# Patient Record
Sex: Female | Born: 1990
Health system: Southern US, Community
[De-identification: ages and names within clinical notes are randomized; demographics above are authoritative.]

## PROBLEM LIST (undated history)

## (undated) DIAGNOSIS — K219 Gastro-esophageal reflux disease without esophagitis: Secondary | ICD-10-CM

## (undated) DIAGNOSIS — J45909 Unspecified asthma, uncomplicated: Secondary | ICD-10-CM

## (undated) DIAGNOSIS — F32A Depression, unspecified: Secondary | ICD-10-CM

## (undated) DIAGNOSIS — F329 Major depressive disorder, single episode, unspecified: Secondary | ICD-10-CM

## (undated) DIAGNOSIS — F419 Anxiety disorder, unspecified: Secondary | ICD-10-CM

## (undated) HISTORY — DX: Depression, unspecified: F32.A

## (undated) HISTORY — DX: Anxiety disorder, unspecified: F41.9

## (undated) HISTORY — DX: Major depressive disorder, single episode, unspecified: F32.9

## (undated) HISTORY — PX: FACIAL RECONSTRUCTION SURGERY: SHX631

## (undated) HISTORY — DX: Gastro-esophageal reflux disease without esophagitis: K21.9

## (undated) HISTORY — PX: TONSILLECTOMY: SUR1361

---
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
## 2015-08-08 ENCOUNTER — Emergency Department (HOSPITAL_COMMUNITY): Payer: Self-pay

## 2015-08-08 ENCOUNTER — Encounter (HOSPITAL_COMMUNITY): Payer: Self-pay | Admitting: Emergency Medicine

## 2015-08-08 ENCOUNTER — Emergency Department (HOSPITAL_COMMUNITY)
Admission: EM | Admit: 2015-08-08 | Discharge: 2015-08-08 | Disposition: A | Discharge status: Home / Self Care | Payer: Self-pay | Attending: Dermatology | Admitting: Dermatology

## 2015-08-08 DIAGNOSIS — R079 Chest pain, unspecified: Secondary | ICD-10-CM | POA: Insufficient documentation

## 2015-08-08 DIAGNOSIS — Z5321 Procedure and treatment not carried out due to patient leaving prior to being seen by health care provider: Secondary | ICD-10-CM | POA: Insufficient documentation

## 2015-08-08 HISTORY — DX: Unspecified asthma, uncomplicated: J45.909

## 2015-08-08 LAB — BASIC METABOLIC PANEL
Anion gap: 9 (ref 5–15)
BUN: 8 mg/dL (ref 6–20)
CALCIUM: 9.7 mg/dL (ref 8.9–10.3)
CO2: 25 mmol/L (ref 22–32)
CREATININE: 0.79 mg/dL (ref 0.44–1.00)
Chloride: 102 mmol/L (ref 101–111)
GLUCOSE: 88 mg/dL (ref 65–99)
Potassium: 4.5 mmol/L (ref 3.5–5.1)
Sodium: 136 mmol/L (ref 135–145)

## 2015-08-08 LAB — I-STAT TROPONIN, ED: TROPONIN I, POC: 0 ng/mL (ref 0.00–0.08)

## 2015-08-08 LAB — CBC
HCT: 36.2 % (ref 36.0–46.0)
Hemoglobin: 11.7 g/dL — ABNORMAL LOW (ref 12.0–15.0)
MCH: 23.8 pg — AB (ref 26.0–34.0)
MCHC: 32.3 g/dL (ref 30.0–36.0)
MCV: 73.7 fL — ABNORMAL LOW (ref 78.0–100.0)
PLATELETS: 306 10*3/uL (ref 150–400)
RBC: 4.91 MIL/uL (ref 3.87–5.11)
RDW: 14.8 % (ref 11.5–15.5)
WBC: 7.4 10*3/uL (ref 4.0–10.5)

## 2015-08-08 NOTE — ED Notes (Signed)
Pt deciding to leave prior to being seen.
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
## 2016-02-13 ENCOUNTER — Encounter (HOSPITAL_COMMUNITY): Payer: Self-pay

## 2016-03-03 ENCOUNTER — Ambulatory Visit (INDEPENDENT_AMBULATORY_CARE_PROVIDER_SITE_OTHER): Payer: 59 | Admitting: Licensed Clinical Social Worker

## 2016-03-03 DIAGNOSIS — F331 Major depressive disorder, recurrent, moderate: Secondary | ICD-10-CM

## 2016-03-03 DIAGNOSIS — F410 Panic disorder [episodic paroxysmal anxiety] without agoraphobia: Secondary | ICD-10-CM | POA: Diagnosis not present
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
## 2016-03-17 ENCOUNTER — Ambulatory Visit (INDEPENDENT_AMBULATORY_CARE_PROVIDER_SITE_OTHER): Payer: 59 | Admitting: Licensed Clinical Social Worker

## 2016-03-17 DIAGNOSIS — F331 Major depressive disorder, recurrent, moderate: Secondary | ICD-10-CM | POA: Diagnosis not present

## 2016-03-17 DIAGNOSIS — F410 Panic disorder [episodic paroxysmal anxiety] without agoraphobia: Secondary | ICD-10-CM

## 2016-03-24 ENCOUNTER — Ambulatory Visit (INDEPENDENT_AMBULATORY_CARE_PROVIDER_SITE_OTHER): Payer: 59 | Admitting: Licensed Clinical Social Worker

## 2016-03-24 DIAGNOSIS — F331 Major depressive disorder, recurrent, moderate: Secondary | ICD-10-CM | POA: Diagnosis not present

## 2016-03-30 NOTE — Progress Notes (Signed)
  THERAPIST PROGRESS NOTE   Date of Service:   03/17/2016  Session Time: 4pm  Patient:   Rebecca Simmons   DOB:   [birth year]  MR Number:  [id number]  Location[facility]  [facility] [place] [address] Dept: [phone]            Provider/Observer:  Lubertha South Counselor  Risk of Suicide/Violence: virtually non-existent   Diagnosis:    Depression, Moderate  Type of Therapy: Individual Therapy  Treatment Goals addressed: Coping  Participation Level: Active     Interventions: CBT, Motivational Interviewing, Solution Focused, Strength-based, Supportive and Reframing   Behavioral Response: Neat and Well GroomedAlertAnxious   Summary: Therapist met with Patient in an initial therapy session to assess current mood and to build rapport. Therapist engaged Patient in discussion about her life and what is going well for her. Therapist provided support for Patient as she shared details about her life, her current stressors, mood, coping skills, her past, and her family. Therapist prompted Patient to discuss her support system and ways that she manages her daily stress, anger, and frustrations. LCSW discussed what psychotherapy is and is not and the importance of the therapeutic relationship to include open and honest communication between client and therapist and building trust.  Reviewed advantages and disadvantages of the therapeutic process and limitations to the therapeutic relationship including LCSW's role in maintaining the safety of the client, others and those in client's care.    Plan: Morgana Rowley will utilize coping skills taught in this session to reduce unwanted thoughts.    Return again in 1 weeks.

## 2016-04-01 ENCOUNTER — Ambulatory Visit: Payer: 59 | Admitting: Licensed Clinical Social Worker

## 2016-04-02 NOTE — Progress Notes (Signed)
  THERAPIST PROGRESS NOTE   Date of Service:   03/24/2016  Session Time: 4pm  Patient:   Zayli Villafuerte   DOB:   Sep 21, 1990  MR Number:  [id number]  Location:  [facility] [place] [address] Dept: [phone]            Provider/Observer:  Lubertha South Counselor  Risk of Suicide/Violence: virtually non-existent   Diagnosis:    Depression, Moderate  Type of Therapy: Individual Therapy  Treatment Goals addressed: Coping  Participation Level: Active     Interventions: CBT, Motivational Interviewing, Solution Focused, Strength-based, Supportive and Reframing   Behavioral Response: Neat and Well GroomedAlertAnxious   Summary: Therapist met with Patient in an outpatient setting to assess current mood and assist with making progress towards his goals through the use of therapeutic intervention. Therapist did a brief mood check, assessing anger, fear, disgust, excitement, happiness, and sadness. Therapist provided active listening for Patient as she communicated her current stressors (getting anxious while working).  Therapist explained the importance of establishing emotional support as well as ensuring that Patient is engaging in adequate self-care during these stress induced times. Therapist then assisted Patient with brainstorming to determine a solution for her current work problem. Therapist and Patient discussed possible financial supports, emotional supports, family, friends, etc.    Plan: Fendi Meinhardt will utilize coping skills taught in this session to reduce unwanted thoughts.    Return again in 1 weeks.

## 2016-04-06 ENCOUNTER — Ambulatory Visit: Payer: 59 | Admitting: Licensed Clinical Social Worker

## 2016-04-10 ENCOUNTER — Ambulatory Visit (INDEPENDENT_AMBULATORY_CARE_PROVIDER_SITE_OTHER): Payer: 59 | Admitting: Licensed Clinical Social Worker

## 2016-04-10 DIAGNOSIS — F331 Major depressive disorder, recurrent, moderate: Secondary | ICD-10-CM | POA: Diagnosis not present
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
## 2016-04-29 ENCOUNTER — Emergency Department
Admission: EM | Admit: 2016-04-29 | Discharge: 2016-04-29 | Disposition: A | Discharge status: Home / Self Care | Payer: 59 | Attending: Emergency Medicine | Admitting: Emergency Medicine

## 2016-04-29 ENCOUNTER — Encounter: Payer: Self-pay | Admitting: Emergency Medicine

## 2016-04-29 ENCOUNTER — Emergency Department: Payer: 59

## 2016-04-29 DIAGNOSIS — S60222A Contusion of left hand, initial encounter: Secondary | ICD-10-CM | POA: Insufficient documentation

## 2016-04-29 DIAGNOSIS — J45909 Unspecified asthma, uncomplicated: Secondary | ICD-10-CM | POA: Diagnosis not present

## 2016-04-29 DIAGNOSIS — Y999 Unspecified external cause status: Secondary | ICD-10-CM | POA: Insufficient documentation

## 2016-04-29 DIAGNOSIS — M7918 Myalgia, other site: Secondary | ICD-10-CM

## 2016-04-29 DIAGNOSIS — S8991XA Unspecified injury of right lower leg, initial encounter: Secondary | ICD-10-CM | POA: Diagnosis present

## 2016-04-29 DIAGNOSIS — Y92410 Unspecified street and highway as the place of occurrence of the external cause: Secondary | ICD-10-CM | POA: Diagnosis not present

## 2016-04-29 DIAGNOSIS — S8011XA Contusion of right lower leg, initial encounter: Secondary | ICD-10-CM | POA: Insufficient documentation

## 2016-04-29 DIAGNOSIS — Y9389 Activity, other specified: Secondary | ICD-10-CM | POA: Insufficient documentation

## 2016-04-29 MED ORDER — KETOROLAC TROMETHAMINE 30 MG/ML IJ SOLN
60.0000 mg | Freq: Once | INTRAMUSCULAR | Status: AC
  Administered 2016-04-29: 60 mg via INTRAMUSCULAR

## 2016-04-29 MED ORDER — KETOROLAC TROMETHAMINE 10 MG PO TABS: 10.0000 mg | ORAL_TABLET | Freq: Three times a day (TID) | ORAL | 0 refills | Status: DC

## 2016-04-29 MED ORDER — KETOROLAC TROMETHAMINE 30 MG/ML IJ SOLN
INTRAMUSCULAR | Status: AC
Start: 2016-04-29 — End: 2016-04-29
  Administered 2016-04-29: 60 mg via INTRAMUSCULAR
  Filled 2016-04-29: qty 1

## 2016-04-29 MED ORDER — CYCLOBENZAPRINE HCL 5 MG PO TABS: 5.0000 mg | ORAL_TABLET | Freq: Three times a day (TID) | ORAL | 0 refills | Status: DC | PRN

## 2016-04-29 MED ORDER — ORPHENADRINE CITRATE 30 MG/ML IJ SOLN
60.0000 mg | INTRAMUSCULAR | Status: AC
  Administered 2016-04-29: 60 mg via INTRAMUSCULAR

## 2016-04-29 MED ORDER — ORPHENADRINE CITRATE 30 MG/ML IJ SOLN
INTRAMUSCULAR | Status: AC
Start: 2016-04-29 — End: 2016-04-29
  Administered 2016-04-29: 60 mg via INTRAMUSCULAR
  Filled 2016-04-29: qty 2

## 2016-04-29 NOTE — ED Notes (Signed)
Pt returned from xray via stretcher.

## 2016-04-29 NOTE — ED Notes (Signed)
Pt stated she didn't want to given urine to have a negative urine preg on record, stated she would sign the paper for xray instead. Xray notified.
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
## 2016-04-29 NOTE — ED Notes (Signed)
Pt taken to xray via stretcher  
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
## 2016-07-08 ENCOUNTER — Ambulatory Visit (INDEPENDENT_AMBULATORY_CARE_PROVIDER_SITE_OTHER): Payer: 59 | Admitting: Obstetrics and Gynecology

## 2016-07-08 ENCOUNTER — Encounter: Payer: Self-pay | Admitting: Obstetrics and Gynecology

## 2016-07-08 VITALS — BP 106/65 | HR 75 | Ht 65.0 in | Wt 241.0 lb

## 2016-07-08 DIAGNOSIS — N83202 Unspecified ovarian cyst, left side: Secondary | ICD-10-CM | POA: Diagnosis not present
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
## 2017-03-29 ENCOUNTER — Other Ambulatory Visit: Payer: Self-pay

## 2017-03-29 MED ORDER — FLUOXETINE HCL 10 MG PO CAPS: ORAL_CAPSULE | ORAL | 3 refills | Status: DC

## 2017-04-23 ENCOUNTER — Ambulatory Visit (INDEPENDENT_AMBULATORY_CARE_PROVIDER_SITE_OTHER): Payer: 59 | Admitting: Nurse Practitioner

## 2017-04-23 ENCOUNTER — Encounter: Payer: Self-pay | Admitting: Nurse Practitioner

## 2017-04-23 VITALS — BP 116/74 | HR 77 | Resp 16 | Ht 65.0 in | Wt 248.0 lb

## 2017-04-23 DIAGNOSIS — F331 Major depressive disorder, recurrent, moderate: Secondary | ICD-10-CM | POA: Diagnosis not present

## 2017-04-23 DIAGNOSIS — G5601 Carpal tunnel syndrome, right upper limb: Secondary | ICD-10-CM

## 2017-04-23 DIAGNOSIS — E559 Vitamin D deficiency, unspecified: Secondary | ICD-10-CM

## 2017-04-23 MED ORDER — PREDNISONE 10 MG (21) PO TBPK: ORAL_TABLET | ORAL | 0 refills | Status: DC

## 2017-04-23 NOTE — Progress Notes (Signed)
[facility] [address] [place], [place] [address]  Internal MEDICINE  Office Visit Note  Patient Name: Rebecca Simmons  [id number]  [id number]  Date of Service: 05/02/2017  Chief Complaint  Patient presents with  . Wrist Pain    right    The patient is c/o right wrist pain, along the medical aspect and into the thumb. Got much worse about 3-4 weeks ago, after sleeping on it worng. Now gets swollen every day, has pocket of fluid which comes up every day. Has to wear a splint on the arm in order to calm the pain down. She is taking nambutone as needed for back pain, provided by pain clinic. Has not taken other pain medication yet.    Wrist Pain   The pain is present in the right hand. This is a new problem. The current episode started 1 to 4 weeks ago. There has been no history of extremity trauma. The problem occurs daily. The problem has been unchanged. The quality of the pain is described as aching. The pain is moderate. The symptoms are aggravated by activity. She has tried NSAIDS for the symptoms. The treatment provided mild relief. history of carpal tunnel.     Pt is here for routine follow up.    Current Medication: Outpatient Encounter Medications as of 04/23/2017  Medication Sig  . albuterol (PROAIR HFA) 108 (90 Base) MCG/ACT inhaler Inhale 2 puffs into the lungs every 6 (six) hours as needed for wheezing or shortness of breath.  . baclofen (LIORESAL) 10 MG tablet TK 1 T PO TID PRN  . busPIRone (BUSPAR) 5 MG tablet buspirone 5 mg tablet  TK 1 T PO BID PRF ACUTE ANXIETY  . cyclobenzaprine (FLEXERIL) 5 MG tablet Take 1 tablet (5 mg total) by mouth 3 (three) times daily as needed for muscle spasms.  Marland Kitchen FLUoxetine (PROZAC) 10 MG capsule Take one capsule by mouth twice daily increase to 2 capsules by mouth twice daily for 1 week prior to menstrual cycle to completion of cycle  . nabumetone (RELAFEN) 750 MG tablet TK 1 T PO Q 12 H PRF ACHING AND STIFFNESS  .  predniSONE (STERAPRED UNI-PAK 21 TAB) 10 MG (21) TBPK tablet 6 day taper - take by mouth as directed for 6 days  . [DISCONTINUED] ketorolac (TORADOL) 10 MG tablet TK 1 T PO Q 8 H   No facility-administered encounter medications on file as of 04/23/2017.     Surgical History: Past Surgical History:  Procedure Laterality Date  . FACIAL RECONSTRUCTION SURGERY     @27  years of age  . TONSILLECTOMY      Medical History: Past Medical History:  Diagnosis Date  . Anxiety   . Asthma   . Depression     Family History: Family History  Problem Relation Age of Onset  . Rheum arthritis Mother   . Cancer Maternal Uncle   . Seizures Maternal Uncle   . Breast cancer Maternal Grandmother   . Diabetes Maternal Grandmother   . Stroke Maternal Grandmother   . Cancer Paternal Grandfather     Social History   Socioeconomic History  . Marital status: Single    Spouse name: Not on file  . Number of children: Not on file  . Years of education: Not on file  . Highest education level: Not on file  Social Needs  . Financial resource strain: Not on file  . Food insecurity - worry: Not on file  . Food insecurity - inability:  Not on file  . Transportation needs - medical: Not on file  . Transportation needs - non-medical: Not on file  Occupational History  . Not on file  Tobacco Use  . Smoking status: Never Smoker  . Smokeless tobacco: Never Used  Substance and Sexual Activity  . Alcohol use: No  . Drug use: No  . Sexual activity: Yes    Birth control/protection: None  Other Topics Concern  . Not on file  Social History Narrative  . Not on file      Review of Systems  Constitutional: Positive for fatigue.  HENT: Negative for congestion, nosebleeds, postnasal drip and sinus pressure.   Eyes: Negative.   Respiratory: Negative for chest tightness and wheezing.   Cardiovascular: Negative for chest pain and palpitations.  Gastrointestinal: Negative.   Endocrine: Negative for cold  intolerance, heat intolerance, polyphagia and polyuria.  Genitourinary: Negative.   Musculoskeletal: Positive for arthralgias.       Right wrist pain  Skin: Negative for rash.  Allergic/Immunologic: Negative for environmental allergies.  Neurological: Negative.   Hematological: Negative.   Psychiatric/Behavioral: Positive for dysphoric mood. The patient is nervous/anxious.     Today's Vitals   04/23/17 0836  BP: 116/74  Pulse: 77  Resp: 16  SpO2: 99%  Weight: 248 lb (112.5 kg)  Height: 5\' 5"  (1.651 m)    Physical Exam  Constitutional: She is oriented to person, place, and time. She appears well-developed and well-nourished. No distress.  HENT:  Head: Normocephalic and atraumatic.  Mouth/Throat: Oropharynx is clear and moist. No oropharyngeal exudate.  Eyes: EOM are normal. Pupils are equal, round, and reactive to light.  Neck: Normal range of motion. Neck supple. No JVD present. No tracheal deviation present. No thyromegaly present.  Cardiovascular: Normal rate, regular rhythm and normal heart sounds. Exam reveals no gallop and no friction rub.  No murmur heard. Pulmonary/Chest: Effort normal and breath sounds normal. No respiratory distress. She has no wheezes. She has no rales. She exhibits no tenderness.  Abdominal: Soft. Bowel sounds are normal. There is no tenderness.  Musculoskeletal:       Right wrist: She exhibits decreased range of motion and tenderness.       Arms: Lymphadenopathy:    She has no cervical adenopathy.  Neurological: She is alert and oriented to person, place, and time. No cranial nerve deficit.  Skin: Skin is warm and dry. She is not diaphoretic.  Psychiatric: She has a normal mood and affect. Her behavior is normal. Judgment and thought content normal.  Nursing note and vitals reviewed.   Assessment/Plan:  1. Carpal tunnel syndrome of right wrist Discussed use of carpal tunnel splint at night. Prednisone taper as directed to reduce pain and  inflammation. reffer to orthopedics for further evaluation and treatment.  - Ambulatory referral to Orthopedic Surgery - predniSONE (STERAPRED UNI-PAK 21 TAB) 10 MG (21) TBPK tablet; 6 day taper - take by mouth as directed for 6 days  Dispense: 21 tablet; Refill: 0  2. Major depressive disorder, recurrent episode, moderate (HCC) Stable. Continue anti-depressant therapy as prescribed. Visits with counselor as scheduled.   3. Vitamin D deficiency Continue use of OTC vitamin d 2000iu daily.   General Counseling: Saja verbalizes understanding of the findings of todays visit and agrees with plan of treatment. I have discussed any further diagnostic evaluation that may be needed or ordered today. We also reviewed her medications today. she has been encouraged to call the office with any questions or concerns  that should arise related to todays visit.   This patient was seen by Vincent Gros, FNP- C in Collaboration with Dr Lyndon Code as a part of collaborative care agreement    Orders Placed This Encounter  Procedures  . Ambulatory referral to Orthopedic Surgery    Meds ordered this encounter  Medications  . predniSONE (STERAPRED UNI-PAK 21 TAB) 10 MG (21) TBPK tablet    Sig: 6 day taper - take by mouth as directed for 6 days    Dispense:  21 tablet    Refill:  0    Order Specific Question:   Supervising Provider    Answer:   Lyndon Code [[id number]]    Time spent: 78 Minutes     Dr Lyndon Code Internal medicine

## 2017-06-03 ENCOUNTER — Telehealth: Payer: Self-pay | Admitting: Nurse Practitioner

## 2017-06-03 NOTE — Telephone Encounter (Signed)
I have treated her for depression/anxiety in the past, but I believed she was seeing psychiatry and counsseling? Last visit I have was for carpal tunnel.

## 2017-06-03 NOTE — Telephone Encounter (Signed)
Left message advising pt that we can only type a simple note stating her medications and what they are prescribed for, we are unable to type letters discussing her medical in depth and if she is needing detailed letter it will need to come from referring provider.Rebecca Simmons

## 2017-10-15 ENCOUNTER — Encounter: Payer: Self-pay | Admitting: Nurse Practitioner

## 2017-10-15 ENCOUNTER — Ambulatory Visit (INDEPENDENT_AMBULATORY_CARE_PROVIDER_SITE_OTHER): Payer: 59 | Admitting: Nurse Practitioner

## 2017-10-15 VITALS — BP 127/75 | HR 75 | Resp 16 | Ht 64.0 in | Wt 249.0 lb

## 2017-10-15 DIAGNOSIS — F331 Major depressive disorder, recurrent, moderate: Secondary | ICD-10-CM

## 2017-10-15 DIAGNOSIS — R5383 Other fatigue: Secondary | ICD-10-CM | POA: Insufficient documentation

## 2017-10-15 MED ORDER — FLUOXETINE HCL 20 MG PO TABS: 20.0000 mg | ORAL_TABLET | Freq: Every day | ORAL | 3 refills | Status: DC

## 2017-10-15 NOTE — Progress Notes (Signed)
[facility] [address] [place], [place] [address]  Internal MEDICINE  Office Visit Note  Patient Name: Rebecca GoldenDominique Simmons  1992  [id number]  Date of Service: 10/15/2017  Chief Complaint  Patient presents with  . Depression    discuss medications    The patient states that she has been having intermittent episodes of worsening episodes. She has had a few days which have been so bad, she has been unable to go to work. Episodes have been happening once every 2 to 3 months and last for a few days. She has seen a counselor in the past, but did not "click" with the particular counselor she saw. She has had intake phone appointment with new counselor. She is waiting to hear about initial appointment. She has brought FMLA paperwork to me and will need to have this filled out to accommodate intermittent leave if needed.       Current Medication: Outpatient Encounter Medications as of 10/15/2017  Medication Sig  . albuterol (PROAIR HFA) 108 (90 Base) MCG/ACT inhaler Inhale 2 puffs into the lungs every 6 (six) hours as needed for wheezing or shortness of breath.  . baclofen (LIORESAL) 10 MG tablet TK 1 T PO TID PRN  . busPIRone (BUSPAR) 5 MG tablet buspirone 5 mg tablet  TK 1 T PO BID PRF ACUTE ANXIETY  . cyclobenzaprine (FLEXERIL) 5 MG tablet Take 1 tablet (5 mg total) by mouth 3 (three) times daily as needed for muscle spasms.  . nabumetone (RELAFEN) 750 MG tablet TK 1 T PO Q 12 H PRF ACHING AND STIFFNESS  . predniSONE (STERAPRED UNI-PAK 21 TAB) 10 MG (21) TBPK tablet 6 day taper - take by mouth as directed for 6 days  . [DISCONTINUED] FLUoxetine (PROZAC) 10 MG capsule Take one capsule by mouth twice daily increase to 2 capsules by mouth twice daily for 1 week prior to menstrual cycle to completion of cycle  . FLUoxetine (PROZAC) 20 MG tablet Take 1 tablet (20 mg total) by mouth daily.   No facility-administered encounter medications on file as of 10/15/2017.     Surgical  History: Past Surgical History:  Procedure Laterality Date  . FACIAL RECONSTRUCTION SURGERY     @27  years of age  . TONSILLECTOMY      Medical History: Past Medical History:  Diagnosis Date  . Anxiety   . Asthma   . Depression     Family History: Family History  Problem Relation Age of Onset  . Rheum arthritis Mother   . Cancer Maternal Uncle   . Seizures Maternal Uncle   . Breast cancer Maternal Grandmother   . Diabetes Maternal Grandmother   . Stroke Maternal Grandmother   . Cancer Paternal Grandfather     Social History   Socioeconomic History  . Marital status: Single    Spouse name: Not on file  . Number of children: Not on file  . Years of education: Not on file  . Highest education level: Not on file  Occupational History  . Not on file  Social Needs  . Financial resource strain: Not on file  . Food insecurity:    Worry: Not on file    Inability: Not on file  . Transportation needs:    Medical: Not on file    Non-medical: Not on file  Tobacco Use  . Smoking status: Never Smoker  . Smokeless tobacco: Never Used  Substance and Sexual Activity  . Alcohol use: Yes    Comment: socially  .  Drug use: No  . Sexual activity: Yes    Birth control/protection: None  Lifestyle  . Physical activity:    Days per week: Not on file    Minutes per session: Not on file  . Stress: Not on file  Relationships  . Social connections:    Talks on phone: Not on file    Gets together: Not on file    Attends religious service: Not on file    Active member of club or organization: Not on file    Attends meetings of clubs or organizations: Not on file    Relationship status: Not on file  . Intimate partner violence:    Fear of current or ex partner: Not on file    Emotionally abused: Not on file    Physically abused: Not on file    Forced sexual activity: Not on file  Other Topics Concern  . Not on file  Social History Narrative  . Not on file      Review of  Systems  Constitutional: Positive for fatigue. Negative for chills and unexpected weight change.  HENT: Negative for congestion, postnasal drip, rhinorrhea, sneezing and sore throat.   Eyes: Negative.  Negative for redness.  Respiratory: Negative for cough, chest tightness and shortness of breath.   Cardiovascular: Negative for chest pain and palpitations.  Gastrointestinal: Negative for abdominal pain, constipation, diarrhea, nausea and vomiting.  Endocrine: Negative for cold intolerance, heat intolerance, polydipsia and polyphagia.  Genitourinary: Negative.  Negative for dysuria and frequency.  Musculoskeletal: Negative for arthralgias, back pain, joint swelling and neck pain.  Skin: Negative for rash.  Allergic/Immunologic: Negative for environmental allergies.  Neurological: Negative for dizziness, tremors, numbness and headaches.  Hematological: Negative for adenopathy. Does not bruise/bleed easily.  Psychiatric/Behavioral: Positive for dysphoric mood. Negative for behavioral problems (Depression), sleep disturbance and suicidal ideas. The patient is nervous/anxious.     Today's Vitals   10/15/17 0917  BP: 127/75  Pulse: 75  Resp: 16  SpO2: 98%  Weight: 249 lb (112.9 kg)  Height: 5\' 4"  (1.626 m)   Physical Exam  Constitutional: She is oriented to person, place, and time. She appears well-developed and well-nourished. No distress.  HENT:  Head: Normocephalic and atraumatic.  Nose: Nose normal.  Mouth/Throat: Oropharynx is clear and moist. No oropharyngeal exudate.  Eyes: Pupils are equal, round, and reactive to light. EOM are normal.  Neck: Normal range of motion. Neck supple. No JVD present. No tracheal deviation present. No thyromegaly present.  Cardiovascular: Normal rate, regular rhythm and normal heart sounds. Exam reveals no gallop and no friction rub.  No murmur heard. Pulmonary/Chest: Effort normal and breath sounds normal. No respiratory distress. She has no wheezes.  She has no rales. She exhibits no tenderness.  Abdominal: Soft. Bowel sounds are normal. There is no tenderness.  Musculoskeletal: Normal range of motion.  Lymphadenopathy:    She has no cervical adenopathy.  Neurological: She is alert and oriented to person, place, and time. No cranial nerve deficit.  Skin: Skin is warm and dry. She is not diaphoretic.  Psychiatric: Her speech is normal and behavior is normal. Judgment and thought content normal. Her mood appears anxious. Cognition and memory are normal. She exhibits a depressed mood.  Nursing note and vitals reviewed.  Assessment/Plan: 1. Other fatigue Likely related to increased depression. Adjust medications and follow up 6 weeks.   2. Major depressive disorder, recurrent episode, moderate (HCC) Increased prozac to 20mg  daily. Continue to take buspirone as needed  and as prescribed. Will fill out FMLA paperwork and retrun to patient when complete. Contact information for [facility] provided today. - FLUoxetine (PROZAC) 20 MG tablet; Take 1 tablet (20 mg total) by mouth daily.  Dispense: 30 tablet; Refill: 3  General Counseling: Lamyra verbalizes understanding of the findings of todays visit and agrees with plan of treatment. I have discussed any further diagnostic evaluation that may be needed or ordered today. We also reviewed her medications today. she has been encouraged to call the office with any questions or concerns that should arise related to todays visit.  This patient was seen by Vincent Gros FNP Collaboration with Dr Lyndon Code as a part of collaborative care agreement  Meds ordered this encounter  Medications  . FLUoxetine (PROZAC) 20 MG tablet    Sig: Take 1 tablet (20 mg total) by mouth daily.    Dispense:  30 tablet    Refill:  3    Please note increased dose. Patient does not need new rx yet. Will call when ready.    Order Specific Question:   Supervising Provider    Answer:   Lyndon Code [[id number]]    Time spent: 59 Minutes      Dr Lyndon Code Internal medicine

## 2017-10-25 ENCOUNTER — Telehealth: Payer: Self-pay

## 2017-10-25 NOTE — Telephone Encounter (Signed)
Patient has called several times regarding her [employer] papers

## 2017-10-26 ENCOUNTER — Telehealth: Payer: Self-pay

## 2017-10-26 NOTE — Telephone Encounter (Signed)
Patient advised that her FMLA paperwork is ready for pickup.Titania

## 2017-10-26 NOTE — Telephone Encounter (Signed)
I finished this completely and put it on Rebecca Simmons's desk this morning.

## 2017-11-26 ENCOUNTER — Encounter: Payer: Self-pay | Admitting: Nurse Practitioner

## 2017-11-26 ENCOUNTER — Ambulatory Visit (INDEPENDENT_AMBULATORY_CARE_PROVIDER_SITE_OTHER): Payer: 59 | Admitting: Nurse Practitioner

## 2017-11-26 VITALS — BP 118/84 | HR 79 | Resp 16 | Ht 64.75 in | Wt 255.8 lb

## 2017-11-26 DIAGNOSIS — F411 Generalized anxiety disorder: Secondary | ICD-10-CM | POA: Diagnosis not present

## 2017-11-26 DIAGNOSIS — F331 Major depressive disorder, recurrent, moderate: Secondary | ICD-10-CM | POA: Diagnosis not present

## 2017-11-26 DIAGNOSIS — R5383 Other fatigue: Secondary | ICD-10-CM

## 2017-11-26 MED ORDER — BUSPIRONE HCL 5 MG PO TABS: 5.0000 mg | ORAL_TABLET | Freq: Three times a day (TID) | ORAL | 3 refills | Status: DC

## 2017-11-26 NOTE — Progress Notes (Signed)
[facility] [address] [place], [place] [address]  Internal MEDICINE  Office Visit Note   Patient Name: Rebecca Simmons  [id number]  [id number]  Date of Service: 11/26/2017  Chief Complaint  Patient presents with  . Depression    follow up increased medication, doing well  . Anxiety    The patient had been having increased episodes of anxiety and panic attacks. I increased her fluoxetine to 20mg  daily. She states that this dose is working well for her for the most part. Feels like herself and is able to control panic attacks while at work. She did have one panic attack, which manifested as left upper quadrant pain and epigastric pain. It resolved on it's own after removal from the triggering situation. She states that buspirone does help when she takes it. Lessens the severity and length of time she has acute panic attack reactions.       Current Medication: Outpatient Encounter Medications as of 11/26/2017  Medication Sig  . albuterol (PROAIR HFA) 108 (90 Base) MCG/ACT inhaler Inhale 2 puffs into the lungs every 6 (six) hours as needed for wheezing or shortness of breath.  . baclofen (LIORESAL) 10 MG tablet TK 1 T PO TID PRN  . busPIRone (BUSPAR) 5 MG tablet Take 1 tablet (5 mg total) by mouth 3 (three) times daily.  Marland Kitchen FLUoxetine (PROZAC) 20 MG tablet Take 1 tablet (20 mg total) by mouth daily.  . nabumetone (RELAFEN) 750 MG tablet TK 1 T PO Q 12 H PRF ACHING AND STIFFNESS  . [DISCONTINUED] busPIRone (BUSPAR) 5 MG tablet buspirone 5 mg tablet  TK 1 T PO BID PRF ACUTE ANXIETY  . [DISCONTINUED] cyclobenzaprine (FLEXERIL) 5 MG tablet Take 1 tablet (5 mg total) by mouth 3 (three) times daily as needed for muscle spasms. (Patient not taking: Reported on 11/26/2017)  . [DISCONTINUED] predniSONE (STERAPRED UNI-PAK 21 TAB) 10 MG (21) TBPK tablet 6 day taper - take by mouth as directed for 6 days (Patient not taking: Reported on 11/26/2017)   No facility-administered encounter  medications on file as of 11/26/2017.     Surgical History: Past Surgical History:  Procedure Laterality Date  . FACIAL RECONSTRUCTION SURGERY     @27  years of age  . TONSILLECTOMY      Medical History: Past Medical History:  Diagnosis Date  . Anxiety   . Asthma   . Depression     Family History: Family History  Problem Relation Age of Onset  . Rheum arthritis Mother   . Cancer Maternal Uncle   . Seizures Maternal Uncle   . Breast cancer Maternal Grandmother   . Diabetes Maternal Grandmother   . Stroke Maternal Grandmother   . Cancer Paternal Grandfather     Social History   Socioeconomic History  . Marital status: Single    Spouse name: Not on file  . Number of children: Not on file  . Years of education: Not on file  . Highest education level: Not on file  Occupational History  . Not on file  Social Needs  . Financial resource strain: Not on file  . Food insecurity:    Worry: Not on file    Inability: Not on file  . Transportation needs:    Medical: Not on file    Non-medical: Not on file  Tobacco Use  . Smoking status: Never Smoker  . Smokeless tobacco: Never Used  Substance and Sexual Activity  . Alcohol use: Yes    Comment: socially  .  Drug use: No  . Sexual activity: Yes    Birth control/protection: None  Lifestyle  . Physical activity:    Days per week: Not on file    Minutes per session: Not on file  . Stress: Not on file  Relationships  . Social connections:    Talks on phone: Not on file    Gets together: Not on file    Attends religious service: Not on file    Active member of club or organization: Not on file    Attends meetings of clubs or organizations: Not on file    Relationship status: Not on file  . Intimate partner violence:    Fear of current or ex partner: Not on file    Emotionally abused: Not on file    Physically abused: Not on file    Forced sexual activity: Not on file  Other Topics Concern  . Not on file  Social  History Narrative  . Not on file      Review of Systems  Constitutional: Negative for activity change, chills, fatigue and unexpected weight change.  HENT: Negative for congestion, postnasal drip, rhinorrhea, sneezing and sore throat.   Eyes: Negative.  Negative for redness.  Respiratory: Negative for cough, chest tightness and shortness of breath.   Cardiovascular: Negative for chest pain and palpitations.  Gastrointestinal: Negative for abdominal pain, constipation, diarrhea, nausea and vomiting.  Endocrine: Negative for cold intolerance, heat intolerance, polydipsia and polyphagia.  Genitourinary: Negative for dysuria and frequency.       Last menstrual cycle was heavier than normal and she passed may blood clots even when not using the bathroom.   Musculoskeletal: Negative for arthralgias, back pain, joint swelling and neck pain.  Skin: Negative for rash.  Allergic/Immunologic: Negative for environmental allergies.  Neurological: Negative for dizziness, tremors, numbness and headaches.  Hematological: Negative for adenopathy. Does not bruise/bleed easily.  Psychiatric/Behavioral: Positive for dysphoric mood. Negative for behavioral problems (Depression), sleep disturbance and suicidal ideas. The patient is nervous/anxious.        Improved and well managed.     Vital Signs: BP 118/84   Pulse 79   Resp 16   Ht 5' 4.75" (1.645 m)   Wt 255 lb 12.8 oz (116 kg)   SpO2 97%   BMI 42.90 kg/m    Physical Exam  Constitutional: She is oriented to person, place, and time. She appears well-developed and well-nourished. No distress.  HENT:  Head: Normocephalic and atraumatic.  Nose: Nose normal.  Mouth/Throat: Oropharynx is clear and moist. No oropharyngeal exudate.  Eyes: Pupils are equal, round, and reactive to light. Conjunctivae and EOM are normal.  Neck: Normal range of motion. Neck supple. No JVD present. No tracheal deviation present. No thyromegaly present.  Cardiovascular:  Normal rate, regular rhythm and normal heart sounds. Exam reveals no gallop and no friction rub.  No murmur heard. Pulmonary/Chest: Effort normal and breath sounds normal. No respiratory distress. She has no wheezes. She has no rales. She exhibits no tenderness.  Abdominal: Soft. Bowel sounds are normal. There is no tenderness.  Musculoskeletal: Normal range of motion.  Lymphadenopathy:    She has no cervical adenopathy.  Neurological: She is alert and oriented to person, place, and time. No cranial nerve deficit.  Skin: Skin is warm and dry. She is not diaphoretic.  Psychiatric: Her speech is normal and behavior is normal. Judgment and thought content normal. Her mood appears anxious. Cognition and memory are normal. She exhibits a depressed  mood.  Nursing note and vitals reviewed.  Assessment/Plan: 1. Other fatigue Improved with improved control of depression and anxiety. Will continue to monitor.   2. Major depressive disorder, recurrent episode, moderate (HCC) Improved. Will continue with prozac at 20mg  daily.   3. Generalized anxiety disorder May use buspar 5mg  three times daily if needed for acute anxiety and panic attacks. New prescription sent to her pharmacy.  - busPIRone (BUSPAR) 5 MG tablet; Take 1 tablet (5 mg total) by mouth 3 (three) times daily.  Dispense: 90 tablet; Refill: 3  General Counseling: Nyra verbalizes understanding of the findings of todays visit and agrees with plan of treatment. I have discussed any further diagnostic evaluation that may be needed or ordered today. We also reviewed her medications today. she has been encouraged to call the office with any questions or concerns that should arise related to todays visit.  This patient was seen by Vincent Gros FNP Collaboration with Dr Lyndon Code as a part of collaborative care agreement   Meds ordered this encounter  Medications  . busPIRone (BUSPAR) 5 MG tablet    Sig: Take 1 tablet (5 mg total) by  mouth 3 (three) times daily.    Dispense:  90 tablet    Refill:  3    Order Specific Question:   Supervising Provider    Answer:   Lyndon Code [[id number]]    Time spent: 28 Minutes      Dr Lyndon Code Internal medicine

## 2017-12-17 ENCOUNTER — Other Ambulatory Visit: Payer: Self-pay

## 2017-12-17 ENCOUNTER — Emergency Department
Admission: EM | Admit: 2017-12-17 | Discharge: 2017-12-17 | Disposition: A | Discharge status: Home / Self Care | Payer: 59 | Attending: Emergency Medicine | Admitting: Emergency Medicine

## 2017-12-17 DIAGNOSIS — Z79899 Other long term (current) drug therapy: Secondary | ICD-10-CM | POA: Insufficient documentation

## 2017-12-17 DIAGNOSIS — K2900 Acute gastritis without bleeding: Secondary | ICD-10-CM | POA: Diagnosis not present

## 2017-12-17 DIAGNOSIS — J45909 Unspecified asthma, uncomplicated: Secondary | ICD-10-CM | POA: Insufficient documentation

## 2017-12-17 DIAGNOSIS — R1010 Upper abdominal pain, unspecified: Secondary | ICD-10-CM | POA: Diagnosis present

## 2017-12-17 LAB — COMPREHENSIVE METABOLIC PANEL
ALBUMIN: 4.1 g/dL (ref 3.5–5.0)
ALT: 24 U/L (ref 0–44)
AST: 26 U/L (ref 15–41)
Alkaline Phosphatase: 73 U/L (ref 38–126)
Anion gap: 9 (ref 5–15)
BILIRUBIN TOTAL: 0.6 mg/dL (ref 0.3–1.2)
BUN: 15 mg/dL (ref 6–20)
CHLORIDE: 101 mmol/L (ref 98–111)
CO2: 28 mmol/L (ref 22–32)
CREATININE: 0.86 mg/dL (ref 0.44–1.00)
Calcium: 9.4 mg/dL (ref 8.9–10.3)
GFR calc Af Amer: >60 mL/min (ref >60)
GLUCOSE: 97 mg/dL (ref 70–99)
POTASSIUM: 4.3 mmol/L (ref 3.5–5.1)
Sodium: 138 mmol/L (ref 135–145)
Total Protein: 7.5 g/dL (ref 6.5–8.1)

## 2017-12-17 LAB — CBC
HEMATOCRIT: 34.9 % — AB (ref 36.0–46.0)
Hemoglobin: 10.8 g/dL — ABNORMAL LOW (ref 12.0–15.0)
MCH: 23.6 pg — ABNORMAL LOW (ref 26.0–34.0)
MCHC: 30.9 g/dL (ref 30.0–36.0)
MCV: 76.2 fL — AB (ref 80.0–100.0)
Platelets: 352 10*3/uL (ref 150–400)
RBC: 4.58 MIL/uL (ref 3.87–5.11)
RDW: 16 % — AB (ref 11.5–15.5)
WBC: 10.3 10*3/uL (ref 4.0–10.5)
nRBC: 0 % (ref 0.0–0.2)

## 2017-12-17 LAB — LIPASE, BLOOD: Lipase: 24 U/L (ref 11–51)

## 2017-12-17 LAB — POCT PREGNANCY, URINE: Preg Test, Ur: NEGATIVE

## 2017-12-17 MED ORDER — GI COCKTAIL ~~LOC~~
30.0000 mL | Freq: Once | ORAL | Status: AC
  Administered 2017-12-17: 30 mL via ORAL

## 2017-12-17 MED ORDER — SUCRALFATE 1 G PO TABS: 1.0000 g | ORAL_TABLET | Freq: Four times a day (QID) | ORAL | 0 refills | Status: DC

## 2017-12-17 MED ORDER — GI COCKTAIL ~~LOC~~
ORAL | Status: AC
  Filled 2017-12-17: qty 30

## 2017-12-17 MED ORDER — FAMOTIDINE IN NACL 20-0.9 MG/50ML-% IV SOLN
20.0000 mg | Freq: Once | INTRAVENOUS | Status: AC
  Administered 2017-12-17: 20 mg via INTRAVENOUS
  Filled 2017-12-17: qty 50

## 2017-12-17 NOTE — ED Notes (Signed)
Pt in with co epigastric pain x 2 days states feels bloated and has had frequent stools. Denies any n.v.d. Or dysuria at this time. States she takes a lot of NSAIDS and has been told she has gastritis. Took prilosec today per PMD.

## 2017-12-17 NOTE — ED Provider Notes (Signed)
[facility] Emergency Department Provider Note   ____________________________________________    I have reviewed the triage vital signs and the nursing notes.   HISTORY  Chief Complaint Abdominal Pain     HPI Rebecca Simmons is a 27 y.o. female who presents with complaints of upper abdominal pain.  Patient reports symptoms have been ongoing for approximately 1 day.  She reports burning discomfort in her epigastrium.  Has tried Prilosec over-the-counter with no improvement.  Denies a history of stomach ulcers, no history of acid reflux.  Mild nausea, no vomiting.  No fevers or chills.  Normal stools.  No history of abdominal surgery   Past Medical History:  Diagnosis Date  . Anxiety   . Asthma   . Depression     Patient Active Problem List   Diagnosis Date Noted  . Generalized anxiety disorder 11/26/2017  . Other fatigue 10/15/2017  . Major depressive disorder, recurrent episode, moderate (HCC) 04/23/2017  . Vitamin D deficiency 04/23/2017    Past Surgical History:  Procedure Laterality Date  . FACIAL RECONSTRUCTION SURGERY     @27  years of age  . TONSILLECTOMY      Prior to Admission medications   Medication Sig Start Date End Date Taking? Authorizing Provider  albuterol (PROAIR HFA) 108 (90 Base) MCG/ACT inhaler Inhale 2 puffs into the lungs every 6 (six) hours as needed for wheezing or shortness of breath.    [provider]  baclofen (LIORESAL) 10 MG tablet TK 1 T PO TID PRN 02/18/17   [provider]  busPIRone (BUSPAR) 5 MG tablet Take 1 tablet (5 mg total) by mouth 3 (three) times daily. 11/26/17   Carlean Jews, NP  FLUoxetine (PROZAC) 20 MG tablet Take 1 tablet (20 mg total) by mouth daily. 10/15/17   Carlean Jews, NP  nabumetone (RELAFEN) 750 MG tablet TK 1 T PO Q 12 H PRF ACHING AND STIFFNESS 02/24/17   [provider]  sucralfate (CARAFATE) 1 g tablet Take 1 tablet (1 g total) by mouth 4 (four)  times daily for 15 days. 12/17/17 01/01/18  Jene Every, MD     Allergies Patient has no known allergies.  Family History  Problem Relation Age of Onset  . Rheum arthritis Mother   . Cancer Maternal Uncle   . Seizures Maternal Uncle   . Breast cancer Maternal Grandmother   . Diabetes Maternal Grandmother   . Stroke Maternal Grandmother   . Cancer Paternal Grandfather     Social History Social History   Tobacco Use  . Smoking status: Never Smoker  . Smokeless tobacco: Never Used  Substance Use Topics  . Alcohol use: Yes    Comment: socially  . Drug use: No    Review of Systems  Constitutional: No fever/chills Eyes: No visual changes.  ENT: No sore throat. Cardiovascular: Denies chest pain. Respiratory: Denies shortness of breath. Gastrointestinal: As above Genitourinary: Negative for dysuria. Musculoskeletal: Negative for back pain. Skin: Negative for rash. Neurological: Negative for headaches   ____________________________________________   PHYSICAL EXAM:  VITAL SIGNS: ED Triage Vitals  Enc Vitals Group     BP 12/17/17 2027 (!) 125/96     Pulse Rate 12/17/17 2027 88     Resp 12/17/17 2027 16     Temp 12/17/17 2027 98.7 F (37.1 C)     Temp Source 12/17/17 2027 Oral     SpO2 12/17/17 2027 99 %     Weight 12/17/17 2032 115.7 kg (  255 lb)     Height 12/17/17 2032 1.651 m (5\' 5" )     Head Circumference --      Peak Flow --      Pain Score 12/17/17 2030 8     Pain Loc --      Pain Edu? --      Excl. in GC? --     Constitutional: Alert and oriented.  Eyes: Conjunctivae are normal.   Nose: No congestion/rhinnorhea. Mouth/Throat: Mucous membranes are moist.    Cardiovascular: Normal rate, regular rhythm. Grossly normal heart sounds.  Good peripheral circulation. Respiratory: Normal respiratory effort.  No retractions. Lungs CTAB. Gastrointestinal: Mild tenderness in the epigastrium, no right upper quadrant pain. No distention.  No CVA  tenderness.  Musculoskeletal:   Warm and well perfused Neurologic:  Normal speech and language. No gross focal neurologic deficits are appreciated.  Skin:  Skin is warm, dry and intact. No rash noted. Psychiatric: Mood and affect are normal. Speech and behavior are normal.  ____________________________________________   LABS (all labs ordered are listed, but only abnormal results are displayed)  Labs Reviewed  CBC - Abnormal; Notable for the following components:      Result Value   Hemoglobin 10.8 (*)    HCT 34.9 (*)    MCV 76.2 (*)    MCH 23.6 (*)    RDW 16.0 (*)    All other components within normal limits  COMPREHENSIVE METABOLIC PANEL  LIPASE, BLOOD  POCT PREGNANCY, URINE   ____________________________________________  EKG   ____________________________________________  RADIOLOGY   ____________________________________________   PROCEDURES  Procedure(s) performed: No  Procedures   Critical Care performed: No ____________________________________________   INITIAL IMPRESSION / ASSESSMENT AND PLAN / ED COURSE  Pertinent labs & imaging results that were available during my care of the patient were reviewed by me and considered in my medical decision making (see chart for details).  Patient well-appearing in no acute distress.  Complains primarily of upper abdominal pain suspicious for gastritis/PUD/acid reflux.  No tenderness over the gallbladder.  Will check labs including lipase, hepatic panel.  Little improvement with GI cocktail  Patient given IV Pepcid, labs checked which are quite reassuring.  Lipase normal.  LFTs normal.  Patient reports improvement after Pepcid.  Continue to suspect gastritis, will discharge with Carafate, she will take the Prilosec that she has at home.  GI referral as needed.    ____________________________________________   FINAL CLINICAL IMPRESSION(S) / ED DIAGNOSES  Final diagnoses:  Acute gastritis without hemorrhage,  unspecified gastritis type        Note:  This document was prepared using Dragon voice recognition software and may include unintentional dictation errors.    Jene Every, MD 12/17/17 2212

## 2017-12-17 NOTE — ED Triage Notes (Signed)
Pt in with co epigastric pain x 2 days denies any vomiting or diarrhea, does have nausea. No dysuria or fever.

## 2017-12-21 ENCOUNTER — Ambulatory Visit (INDEPENDENT_AMBULATORY_CARE_PROVIDER_SITE_OTHER): Payer: 59 | Admitting: Nurse Practitioner

## 2017-12-21 ENCOUNTER — Encounter: Payer: Self-pay | Admitting: Nurse Practitioner

## 2017-12-21 VITALS — BP 110/80 | HR 72 | Resp 16 | Ht 64.7 in | Wt 261.4 lb

## 2017-12-21 DIAGNOSIS — R635 Abnormal weight gain: Secondary | ICD-10-CM | POA: Insufficient documentation

## 2017-12-21 DIAGNOSIS — K279 Peptic ulcer, site unspecified, unspecified as acute or chronic, without hemorrhage or perforation: Secondary | ICD-10-CM | POA: Diagnosis not present

## 2017-12-21 DIAGNOSIS — F331 Major depressive disorder, recurrent, moderate: Secondary | ICD-10-CM

## 2017-12-21 DIAGNOSIS — Z0001 Encounter for general adult medical examination with abnormal findings: Secondary | ICD-10-CM | POA: Diagnosis not present

## 2017-12-21 DIAGNOSIS — K219 Gastro-esophageal reflux disease without esophagitis: Secondary | ICD-10-CM | POA: Diagnosis not present

## 2017-12-21 MED ORDER — OMEPRAZOLE 40 MG PO CPDR: 40.0000 mg | DELAYED_RELEASE_CAPSULE | Freq: Every day | ORAL | 3 refills | Status: DC

## 2017-12-21 NOTE — Progress Notes (Signed)
[facility] [place], [place] [address]  Internal MEDICINE  Office Visit Note  Patient Name: Rebecca Simmons  [id number]  [id number]  Date of Service: 12/21/2017   Pt is here for routine health maintenance examination   Chief Complaint  Patient presents with  . Asthma  . Anxiety     The patient has been having epigastric pain and bloating for past few weeks. Pain is moderate. Worse with acidic and spicy foods. The pain started to get very severe last Friday. Developed a bulging in her stomach. Ended up going to the ER. Was diagnosed with gastritis. Was prescribed sucralfate and pepcid. Was also referred to GI. Has not made this appointment. Wanted to see what I thought first. Pain is not improved much with current treatment. Bloating is some better, but very sensitive to eating. Does have history of IBS, but not having constipation at this time. Does take NSAIDs routinely due to chronic joint pain. Reviewed labs done in Er on 12/17/2017. She was mildly anemic. She states this is a chronic issue.    Current Medication: Outpatient Encounter Medications as of 12/21/2017  Medication Sig  . albuterol (PROAIR HFA) 108 (90 Base) MCG/ACT inhaler Inhale 2 puffs into the lungs every 6 (six) hours as needed for wheezing or shortness of breath.  . baclofen (LIORESAL) 10 MG tablet TK 1 T PO TID PRN  . busPIRone (BUSPAR) 5 MG tablet Take 1 tablet (5 mg total) by mouth 3 (three) times daily.  Marland Kitchen FLUoxetine (PROZAC) 20 MG tablet Take 1 tablet (20 mg total) by mouth daily.  . nabumetone (RELAFEN) 750 MG tablet TK 1 T PO Q 12 H PRF ACHING AND STIFFNESS  . sucralfate (CARAFATE) 1 g tablet Take 1 tablet (1 g total) by mouth 4 (four) times daily for 15 days.  Marland Kitchen omeprazole (PRILOSEC) 40 MG capsule Take 1 capsule (40 mg total) by mouth daily.   No facility-administered encounter medications on file as of 12/21/2017.     Surgical History: Past Surgical History:  Procedure  Laterality Date  . FACIAL RECONSTRUCTION SURGERY     _58  years of age  . TONSILLECTOMY      Medical History: Past Medical History:  Diagnosis Date  . Anxiety   . Asthma   . Depression     Family History: Family History  Problem Relation Age of Onset  . Rheum arthritis Mother   . Cancer Maternal Uncle   . Seizures Maternal Uncle   . Breast cancer Maternal Grandmother   . Diabetes Maternal Grandmother   . Stroke Maternal Grandmother   . Cancer Paternal Grandfather       Review of Systems  Constitutional: Negative for chills, fatigue and unexpected weight change.  HENT: Negative for congestion, postnasal drip, rhinorrhea, sneezing and sore throat.   Eyes: Negative.  Negative for redness.  Respiratory: Negative for cough, chest tightness, shortness of breath and wheezing.   Cardiovascular: Negative for chest pain and palpitations.  Gastrointestinal: Positive for abdominal distention, abdominal pain and constipation. Negative for diarrhea, nausea and vomiting.       Bloating. GERD.   Endocrine: Negative for cold intolerance, heat intolerance, polydipsia, polyphagia and polyuria.  Genitourinary: Negative.  Negative for dysuria and frequency.  Musculoskeletal: Negative for arthralgias, back pain, joint swelling and neck pain.  Skin: Negative for rash.  Neurological: Negative.  Negative for tremors and numbness.  Hematological: Negative for adenopathy. Does not bruise/bleed easily.  Psychiatric/Behavioral: Positive for dysphoric mood. Negative for  behavioral problems (Depression), sleep disturbance and suicidal ideas. The patient is nervous/anxious.        [place] relatively well with current medication.      Today's Vitals   12/21/17 1100  BP: 110/80  Pulse: 72  Resp: 16  SpO2: 98%  Weight: 261 lb 6.4 oz (118.6 kg)  Height: 5' 4.7" (1.643 m)    Physical Exam  Constitutional: She is oriented to person, place, and time. She appears well-developed and well-nourished. No  distress.  HENT:  Head: Normocephalic and atraumatic.  Nose: Nose normal.  Mouth/Throat: Oropharynx is clear and moist. No oropharyngeal exudate.  Eyes: Pupils are equal, round, and reactive to light. Conjunctivae and EOM are normal.  Neck: Normal range of motion. Neck supple. No JVD present. No tracheal deviation present. No thyromegaly present.  Cardiovascular: Normal rate, regular rhythm, normal heart sounds and intact distal pulses. Exam reveals no gallop and no friction rub.  No murmur heard. Pulmonary/Chest: Effort normal and breath sounds normal. No respiratory distress. She has no wheezes. She has no rales. She exhibits no tenderness. Right breast exhibits no inverted nipple, no mass, no nipple discharge, no skin change and no tenderness. Left breast exhibits no inverted nipple, no mass, no nipple discharge, no skin change and no tenderness.  Abdominal: Soft. Bowel sounds are normal. There is tenderness in the epigastric area and left upper quadrant. There is no rigidity and no guarding.  Musculoskeletal: Normal range of motion.  Lymphadenopathy:    She has no cervical adenopathy.  Neurological: She is alert and oriented to person, place, and time. No cranial nerve deficit.  Skin: Skin is warm and dry. Capillary refill takes 2 to 3 seconds. She is not diaphoretic.  Psychiatric: She has a normal mood and affect. Her behavior is normal. Judgment and thought content normal.  Nursing note and vitals reviewed.    LABS: Recent Results (from the past 2160 hour(s))  CBC     Status: Abnormal   Collection Time: 12/17/17  8:59 PM  Result Value Ref Range   WBC 10.3 4.0 - 10.5 K/uL   RBC 4.58 3.87 - 5.11 MIL/uL   Hemoglobin 10.8 (L) 12.0 - 15.0 g/dL   HCT 34.9 (L) 36.0 - 46.0 %   MCV 76.2 (L) 80.0 - 100.0 fL   MCH 23.6 (L) 26.0 - 34.0 pg   MCHC 30.9 30.0 - 36.0 g/dL   RDW 16.0 (H) 11.5 - 15.5 %   Platelets 352 150 - 400 K/uL   nRBC 0.0 0.0 - 0.2 %    Comment: Performed at [facility], [address] [place], [place] [address]  Comprehensive metabolic panel     Status: None   Collection Time: 12/17/17  8:59 PM  Result Value Ref Range   Sodium 138 135 - 145 mmol/L   Potassium 4.3 3.5 - 5.1 mmol/L   Chloride 101 98 - 111 mmol/L   CO2 28 22 - 32 mmol/L   Glucose, Bld 97 70 - 99 mg/dL   BUN 15 6 - 20 mg/dL   Creatinine, Ser 0.86 0.44 - 1.00 mg/dL   Calcium 9.4 8.9 - 10.3 mg/dL   Total Protein 7.5 6.5 - 8.1 g/dL   Albumin 4.1 3.5 - 5.0 g/dL   AST 26 15 - 41 U/L   ALT 24 0 - 44 U/L   Alkaline Phosphatase 73 38 - 126 U/L   Total Bilirubin 0.6 0.3 - 1.2 mg/dL   GFR calc non Af Amer >  60 >60 mL/min   GFR calc Af Amer >60 >60 mL/min    Comment: (NOTE) The eGFR has been calculated using the CKD EPI equation. This calculation has not been validated in all clinical situations. eGFR's persistently <60 mL/min signify possible Chronic Kidney Disease.    Anion gap 9 5 - 15    Comment: Performed at [facility], [address] [place], [place] [address]  Lipase, blood     Status: None   Collection Time: 12/17/17  8:59 PM  Result Value Ref Range   Lipase 24 11 - 51 U/L    Comment: Performed at [facility], [address] [place], [place] [address]  Pregnancy, urine POC     Status: None   Collection Time: 12/17/17  9:11 PM  Result Value Ref Range   Preg Test, Ur NEGATIVE NEGATIVE    Comment:        THE SENSITIVITY OF THIS METHODOLOGY IS >24 mIU/mL    Assessment/Plan:  1. Encounter for general adult medical examination with abnormal findings Annual health maintenance exam today  - UA/M w/rflx Culture, Routine  2. Peptic ulcer disease Will get UGI with KUB for further evaluation. Will add omeprazole 55m daily. Continue sucralfate as prescribed in ER. May take pepcid as needed.  Will check labs, including serum h.pylori. Treat as indicated.  - DG UGI  W/KUB; Future - omeprazole (PRILOSEC) 40 MG capsule; Take 1 capsule (40 mg total)  by mouth daily.  Dispense: 30 capsule; Refill: 3  3. Gastroesophageal reflux disease without esophagitis Will add omeprazole 43mdaily. Continue sucralfate as prescribed in ER. May take pepcid as needed.  Will check labs, including serum h.pylori. Treat as indicated.  - omeprazole (PRILOSEC) 40 MG capsule; Take 1 capsule (40 mg total) by mouth daily.  Dispense: 30 capsule; Refill: 3  4. Abnormal weight gain Will check labs, including thyroid panel.   5. Major depressive disorder, recurrent episode, moderate (HC[place]ellingtonContinue anti-depressant therapy and anxiety medication as prescribed  General Counseling: Alara verbalizes understanding of the findings of todays visit and agrees with plan of treatment. I have discussed any further diagnostic evaluation that may be needed or ordered today. We also reviewed her medications today. she has been encouraged to call the office with any questions or concerns that should arise related to todays visit.    Counseling:  This patient was seen by HeLeretha PolNP Collaboration with Dr FoLavera Guises a part of collaborative care agreement   Orders Placed This Encounter  Procedures  . DG UGI  W/KUB  . UA/M w/rflx Culture, Routine    Meds ordered this encounter  Medications  . omeprazole (PRILOSEC) 40 MG capsule    Sig: Take 1 capsule (40 mg total) by mouth daily.    Dispense:  30 capsule    Refill:  3    Order Specific Question:   Supervising Provider    Answer:   KHLavera [id number]]    Time spent: 45WesternportMD  Internal Medicine

## 2017-12-22 LAB — UA/M W/RFLX CULTURE, ROUTINE
BILIRUBIN UA: NEGATIVE
GLUCOSE, UA: NEGATIVE
KETONES UA: NEGATIVE
LEUKOCYTES UA: NEGATIVE
NITRITE UA: NEGATIVE
PROTEIN UA: NEGATIVE
RBC, UA: NEGATIVE
SPEC GRAV UA: 1.013 (ref 1.005–1.030)
UUROB: 0.2 mg/dL (ref 0.2–1.0)
pH, UA: 8 — ABNORMAL HIGH (ref 5.0–7.5)

## 2017-12-22 LAB — MICROSCOPIC EXAMINATION
BACTERIA UA: NONE SEEN
CASTS: NONE SEEN /LPF
RBC, UA: NONE SEEN /hpf (ref 0–2)

## 2017-12-28 ENCOUNTER — Other Ambulatory Visit: Payer: Self-pay | Admitting: Nurse Practitioner

## 2017-12-28 ENCOUNTER — Ambulatory Visit
Admission: RE | Admit: 2017-12-28 | Discharge: 2017-12-28 | Disposition: A | Discharge status: Home / Self Care | Payer: 59 | Source: Ambulatory Visit | Attending: Nurse Practitioner | Admitting: Nurse Practitioner

## 2017-12-28 DIAGNOSIS — K313 Pylorospasm, not elsewhere classified: Secondary | ICD-10-CM | POA: Insufficient documentation

## 2017-12-28 DIAGNOSIS — K2970 Gastritis, unspecified, without bleeding: Secondary | ICD-10-CM | POA: Diagnosis present

## 2017-12-28 DIAGNOSIS — K279 Peptic ulcer, site unspecified, unspecified as acute or chronic, without hemorrhage or perforation: Secondary | ICD-10-CM

## 2017-12-30 LAB — COMPREHENSIVE METABOLIC PANEL
ALBUMIN: 4.2 g/dL (ref 3.5–5.5)
ALT: 18 IU/L (ref 0–32)
AST: 35 IU/L (ref 0–40)
Albumin/Globulin Ratio: 1.7 (ref 1.2–2.2)
Alkaline Phosphatase: 75 IU/L (ref 39–117)
BUN / CREAT RATIO: 14 (ref 9–23)
BUN: 11 mg/dL (ref 6–20)
Bilirubin Total: 0.2 mg/dL (ref 0.0–1.2)
CO2: 21 mmol/L (ref 20–29)
CREATININE: 0.76 mg/dL (ref 0.57–1.00)
Calcium: 9.3 mg/dL (ref 8.7–10.2)
Chloride: 101 mmol/L (ref 96–106)
GFR calc non Af Amer: 109 mL/min/{1.73_m2} (ref >59)
GFR, EST AFRICAN AMERICAN: 126 mL/min/{1.73_m2} (ref >59)
GLOBULIN, TOTAL: 2.5 g/dL (ref 1.5–4.5)
Glucose: 81 mg/dL (ref 65–99)
Potassium: 4.6 mmol/L (ref 3.5–5.2)
SODIUM: 137 mmol/L (ref 134–144)
Total Protein: 6.7 g/dL (ref 6.0–8.5)

## 2017-12-30 LAB — CBC
Hematocrit: 33 % — ABNORMAL LOW (ref 34.0–46.6)
Hemoglobin: 10.6 g/dL — ABNORMAL LOW (ref 11.1–15.9)
MCH: 23.6 pg — AB (ref 26.6–33.0)
MCHC: 32.1 g/dL (ref 31.5–35.7)
MCV: 74 fL — AB (ref 79–97)
PLATELETS: 325 10*3/uL (ref 150–450)
RBC: 4.49 x10E6/uL (ref 3.77–5.28)
RDW: 15.8 % — AB (ref 12.3–15.4)
WBC: 7.6 10*3/uL (ref 3.4–10.8)

## 2017-12-30 LAB — H PYLORI, IGM, IGG, IGA AB
H pylori, IgM Abs: 11.3 units — ABNORMAL HIGH (ref 0.0–8.9)
H. pylori, IgA Abs: <9 units (ref 0.0–8.9)
H. pylori, IgG AbS: 0.26 Index Value (ref 0.00–0.79)

## 2017-12-30 LAB — TSH: TSH: 2.5 u[IU]/mL (ref 0.450–4.500)

## 2017-12-30 LAB — T3: T3, Total: 120 ng/dL (ref 71–180)

## 2017-12-30 LAB — T4, FREE: FREE T4: 1.12 ng/dL (ref 0.82–1.77)

## 2017-12-30 LAB — VITAMIN D 25 HYDROXY (VIT D DEFICIENCY, FRACTURES): Vit D, 25-Hydroxy: 72.4 ng/mL (ref 30.0–100.0)

## 2018-01-10 ENCOUNTER — Other Ambulatory Visit: Payer: Self-pay | Admitting: Nurse Practitioner

## 2018-01-10 DIAGNOSIS — K279 Peptic ulcer, site unspecified, unspecified as acute or chronic, without hemorrhage or perforation: Secondary | ICD-10-CM

## 2018-01-10 MED ORDER — AMOXICILLIN 875 MG PO TABS: 875.0000 mg | ORAL_TABLET | Freq: Two times a day (BID) | ORAL | 0 refills | Status: DC

## 2018-01-10 MED ORDER — CLARITHROMYCIN 500 MG PO TABS: 500.0000 mg | ORAL_TABLET | Freq: Two times a day (BID) | ORAL | 0 refills | Status: DC

## 2018-01-10 NOTE — Progress Notes (Signed)
Labs indicate positive h.pylori, which is bacteria which can cause peptic and duodena ulcers. Add biaxin 500mg  bid and amoxicillin 875mg  bid, both for 10 days. She should continue on previously prescirbed prilosec.

## 2018-01-24 ENCOUNTER — Emergency Department
Admission: EM | Admit: 2018-01-24 | Discharge: 2018-01-24 | Disposition: A | Discharge status: Home / Self Care | Payer: 59 | Attending: Emergency Medicine | Admitting: Emergency Medicine

## 2018-01-24 ENCOUNTER — Emergency Department: Payer: 59

## 2018-01-24 ENCOUNTER — Encounter: Payer: Self-pay | Admitting: Emergency Medicine

## 2018-01-24 DIAGNOSIS — J45909 Unspecified asthma, uncomplicated: Secondary | ICD-10-CM | POA: Insufficient documentation

## 2018-01-24 DIAGNOSIS — R109 Unspecified abdominal pain: Secondary | ICD-10-CM

## 2018-01-24 DIAGNOSIS — Z79899 Other long term (current) drug therapy: Secondary | ICD-10-CM | POA: Diagnosis not present

## 2018-01-24 DIAGNOSIS — I880 Nonspecific mesenteric lymphadenitis: Secondary | ICD-10-CM | POA: Insufficient documentation

## 2018-01-24 DIAGNOSIS — R1010 Upper abdominal pain, unspecified: Secondary | ICD-10-CM | POA: Diagnosis present

## 2018-01-24 LAB — I-STAT BETA HCG BLOOD, ED (NOT ORDERABLE)

## 2018-01-24 LAB — CBC WITH DIFFERENTIAL/PLATELET
Abs Immature Granulocytes: 0.04 10*3/uL (ref 0.00–0.07)
BASOS ABS: 0.1 10*3/uL (ref 0.0–0.1)
Basophils Relative: 1 %
EOS ABS: 0.2 10*3/uL (ref 0.0–0.5)
Eosinophils Relative: 3 %
HEMATOCRIT: 34.5 % — AB (ref 36.0–46.0)
Hemoglobin: 10.9 g/dL — ABNORMAL LOW (ref 12.0–15.0)
IMMATURE GRANULOCYTES: 0 %
LYMPHS ABS: 2.5 10*3/uL (ref 0.7–4.0)
Lymphocytes Relative: 28 %
MCH: 23.6 pg — ABNORMAL LOW (ref 26.0–34.0)
MCHC: 31.6 g/dL (ref 30.0–36.0)
MCV: 74.8 fL — ABNORMAL LOW (ref 80.0–100.0)
Monocytes Absolute: 0.7 10*3/uL (ref 0.1–1.0)
Monocytes Relative: 8 %
NRBC: 0 % (ref 0.0–0.2)
Neutro Abs: 5.4 10*3/uL (ref 1.7–7.7)
Neutrophils Relative %: 60 %
Platelets: 304 10*3/uL (ref 150–400)
RBC: 4.61 MIL/uL (ref 3.87–5.11)
RDW: 15.6 % — AB (ref 11.5–15.5)
WBC: 9 10*3/uL (ref 4.0–10.5)

## 2018-01-24 LAB — LIPASE, BLOOD: Lipase: 31 U/L (ref 11–51)

## 2018-01-24 LAB — COMPREHENSIVE METABOLIC PANEL
ALBUMIN: 3.9 g/dL (ref 3.5–5.0)
ALT: 19 U/L (ref 0–44)
ANION GAP: 7 (ref 5–15)
AST: 28 U/L (ref 15–41)
Alkaline Phosphatase: 67 U/L (ref 38–126)
BUN: 7 mg/dL (ref 6–20)
CHLORIDE: 104 mmol/L (ref 98–111)
CO2: 28 mmol/L (ref 22–32)
Calcium: 9.1 mg/dL (ref 8.9–10.3)
Creatinine, Ser: 0.67 mg/dL (ref 0.44–1.00)
GFR calc Af Amer: >60 mL/min (ref >60)
GFR calc non Af Amer: >60 mL/min (ref >60)
GLUCOSE: 130 mg/dL — AB (ref 70–99)
POTASSIUM: 4.6 mmol/L (ref 3.5–5.1)
Sodium: 139 mmol/L (ref 135–145)
Total Bilirubin: 0.6 mg/dL (ref 0.3–1.2)
Total Protein: 7.2 g/dL (ref 6.5–8.1)

## 2018-01-24 MED ORDER — DICYCLOMINE HCL 20 MG PO TABS: 20.0000 mg | ORAL_TABLET | Freq: Three times a day (TID) | ORAL | 0 refills | Status: DC | PRN

## 2018-01-24 MED ORDER — HALOPERIDOL LACTATE 5 MG/ML IJ SOLN
2.5000 mg | Freq: Once | INTRAMUSCULAR | Status: AC
  Administered 2018-01-24: 2.5 mg via INTRAVENOUS
  Filled 2018-01-24: qty 1

## 2018-01-24 MED ORDER — ONDANSETRON 4 MG PO TBDP: 4.0000 mg | ORAL_TABLET | Freq: Three times a day (TID) | ORAL | 0 refills | Status: DC | PRN

## 2018-01-24 MED ORDER — SODIUM CHLORIDE 0.9 % IV BOLUS
1000.0000 mL | Freq: Once | INTRAVENOUS | Status: AC
  Administered 2018-01-24: 1000 mL via INTRAVENOUS

## 2018-01-24 MED ORDER — DICYCLOMINE HCL 10 MG/ML IM SOLN
20.0000 mg | Freq: Once | INTRAMUSCULAR | Status: AC
  Administered 2018-01-24: 20 mg via INTRAMUSCULAR
  Filled 2018-01-24: qty 2

## 2018-01-24 MED ORDER — HYOSCYAMINE SULFATE 0.125 MG PO TBDP
0.1250 mg | ORAL_TABLET | Freq: Once | ORAL | Status: AC
  Administered 2018-01-24: 0.125 mg via ORAL
  Filled 2018-01-24: qty 1

## 2018-01-24 NOTE — Discharge Instructions (Signed)
Fortunately today your lab work was reassuring.  Your CT scan is consistent with a little bit of swelling and cramping probably related to the sushi you 8.  Please make sure you remain well-hydrated and take your anti-cramping medication as needed for severe symptoms.  Return to the emergency department sooner for any concerns.  It was a pleasure to take care of you today, and thank you for coming to our emergency department.  If you have any questions or concerns before leaving please ask the nurse to grab me and I'm more than happy to go through your aftercare instructions again.  If you were prescribed any opioid pain medication today such as Norco, Vicodin, Percocet, morphine, hydrocodone, or oxycodone please make sure you do not drive when you are taking this medication as it can alter your ability to drive safely.  If you have any concerns once you are home that you are not improving or are in fact getting worse before you can make it to your follow-up appointment, please do not hesitate to call 911 and come back for further evaluation.  Merrily Brittle, MD  Results for orders placed or performed during the hospital encounter of 01/24/18  Comprehensive metabolic panel  Result Value Ref Range   Sodium 139 135 - 145 mmol/L   Potassium 4.6 3.5 - 5.1 mmol/L   Chloride 104 98 - 111 mmol/L   CO2 28 22 - 32 mmol/L   Glucose, Bld 130 (H) 70 - 99 mg/dL   BUN 7 6 - 20 mg/dL   Creatinine, Ser 1.61 0.44 - 1.00 mg/dL   Calcium 9.1 8.9 - 09.6 mg/dL   Total Protein 7.2 6.5 - 8.1 g/dL   Albumin 3.9 3.5 - 5.0 g/dL   AST 28 15 - 41 U/L   ALT 19 0 - 44 U/L   Alkaline Phosphatase 67 38 - 126 U/L   Total Bilirubin 0.6 0.3 - 1.2 mg/dL   GFR calc non Af Amer >60 >60 mL/min   GFR calc Af Amer >60 >60 mL/min   Anion gap 7 5 - 15  Lipase, blood  Result Value Ref Range   Lipase 31 11 - 51 U/L  CBC with Differential  Result Value Ref Range   WBC 9.0 4.0 - 10.5 K/uL   RBC 4.61 3.87 - 5.11 MIL/uL   Hemoglobin 10.9 (L) 12.0 - 15.0 g/dL   HCT 04.5 (L) 40.9 - 81.1 %   MCV 74.8 (L) 80.0 - 100.0 fL   MCH 23.6 (L) 26.0 - 34.0 pg   MCHC 31.6 30.0 - 36.0 g/dL   RDW 91.4 (H) 78.2 - 95.6 %   Platelets 304 150 - 400 K/uL   nRBC 0.0 0.0 - 0.2 %   Neutrophils Relative % 60 %   Neutro Abs 5.4 1.7 - 7.7 K/uL   Lymphocytes Relative 28 %   Lymphs Abs 2.5 0.7 - 4.0 K/uL   Monocytes Relative 8 %   Monocytes Absolute 0.7 0.1 - 1.0 K/uL   Eosinophils Relative 3 %   Eosinophils Absolute 0.2 0.0 - 0.5 K/uL   Basophils Relative 1 %   Basophils Absolute 0.1 0.0 - 0.1 K/uL   Immature Granulocytes 0 %   Abs Immature Granulocytes 0.04 0.00 - 0.07 K/uL  I-Stat beta hCG blood, ED  Result Value Ref Range   I-stat hCG, quantitative <5.0 <5 mIU/mL   Comment 3           Ct Abdomen Pelvis Wo Contrast  Result Date: 01/24/2018 CLINICAL DATA:  Abdominal pain. EXAM: CT ABDOMEN AND PELVIS WITHOUT CONTRAST TECHNIQUE: Multidetector CT imaging of the abdomen and pelvis was performed following the standard protocol without IV contrast. COMPARISON:  None. FINDINGS: Lower chest: Lung bases are clear. Hepatobiliary: No focal hepatic abnormality on noncontrast exam. Gallbladder is nondistended, likely due to p.o. ingestion. No calcified gallstone or pericholecystic inflammation. Pancreas: No ductal dilatation or inflammation. Spleen: Normal in size without focal abnormality. Adrenals/Urinary Tract: Normal adrenal glands. No hydronephrosis or perinephric edema. No urolithiasis. Both ureters are decompressed without stones along the course. Urinary bladder is partially distended, no bladder stone or wall thickening. Stomach/Bowel: Bowel evaluation limited in the absence of enteric contrast. Stomach is distended with ingested material. No small bowel dilatation, inflammation or evident wall thickening. Normal appendix. Vascular/Lymphatic: Increased number of ileocolic and central mesenteric nodes, all remain subcentimeter short  axis. Subcentimeter retroperitoneal nodes. Noncontrast vascular structures are unremarkable. Reproductive: Small amount of free fluid in the pelvis may be physiologic. Ovaries are not well-defined given adjacent decompressed bowel. Non-contrast uterus is unremarkable. Other: Small amount of free fluid in the pelvis. No free air. Small fat containing umbilical hernia. Musculoskeletal: There are no acute or suspicious osseous abnormalities. Transitional lumbosacral anatomy, incidental. IMPRESSION: 1. Increased number of multiple prominent ileocolic and central mesenteric nodes, suggesting mesenteric adenitis. 2. Small amount of free fluid in the pelvis may be reactive or physiologic. Electronically Signed   By: Narda RutherfordMelanie  Sanford M.D.   On: 01/24/2018 02:21   Dg Ugi  W/kub  Result Date: 12/28/2017 CLINICAL DATA:  Nausea, abdominal pain, bloating. EXAM: UPPER GI SERIES WITH KUB TECHNIQUE: After obtaining a scout radiograph a routine upper GI series was performed using high density and thin barium. FLUOROSCOPY TIME:  Fluoroscopy Time:  2 minutes 54 seconds Radiation Exposure Index (162.8 mGy scratch Number of Acquired Spot Images: 21 COMPARISON:  Chest x-ray 08/08/2015. FINDINGS: Scout film demonstrates no acute abnormality. Pelvic calcifications noted consistent phleboliths. The esophagus is widely patent. No obstructing abnormalities identified. No hiatal hernia or reflux. Stomach and duodenal ball are normal. C-loop normal. No evidence of ulceration. Mild pylorospasm noted. IMPRESSION: 1. Mild pylorospasm. No evidence of gastric or duodenal ulceration. No evidence of gastroesophageal reflux. 2.  Exam otherwise unremarkable. Electronically Signed   By: Maisie Fushomas  Register   On: 12/28/2017 09:22

## 2018-01-24 NOTE — ED Notes (Signed)
ED Provider at bedside. 

## 2018-01-24 NOTE — ED Provider Notes (Signed)
[facility] Emergency Department Provider Note  ____________________________________________   First MD Initiated Contact with Patient 01/24/18 0129     (approximate)  I have reviewed the triage vital signs and the nursing notes.   HISTORY  Chief Complaint Abdominal Pain    HPI Rebecca Simmons is a 27 y.o. female who self presented to the emergency department with cramping upper abdominal pain as well as left-sided abdominal pain that began acutely 30 minutes after eating sushi for dinner.  She denies fevers or chills.  She does have a past medical history of morbid obesity as well as a recent diagnosis of H. pylori for which she has completed a course of triple therapy.  No dysuria frequency or hesitancy.  She denies back pain.  Her symptoms came on suddenly were moderate in severity are now constant.  She describes it as "cramping".  No history of abdominal surgeries.  No diarrhea.  She does feel like she has to have a bowel movement.  Symptoms are sudden onset, constant, moderate to severe, and nothing seems to make them better or worse.   Past Medical History:  Diagnosis Date  . Anxiety   . Asthma   . Depression     Patient Active Problem List   Diagnosis Date Noted  . Encounter for general adult medical examination with abnormal findings 12/21/2017  . Peptic ulcer disease 12/21/2017  . Gastroesophageal reflux disease without esophagitis 12/21/2017  . Abnormal weight gain 12/21/2017  . Generalized anxiety disorder 11/26/2017  . Other fatigue 10/15/2017  . Major depressive disorder, recurrent episode, moderate (HCC) 04/23/2017  . Vitamin D deficiency 04/23/2017    Past Surgical History:  Procedure Laterality Date  . FACIAL RECONSTRUCTION SURGERY     @27  years of age  . TONSILLECTOMY      Prior to Admission medications   Medication Sig Start Date End Date Taking? Authorizing Provider  albuterol (PROAIR HFA) 108 (90 Base) MCG/ACT inhaler  Inhale 2 puffs into the lungs every 6 (six) hours as needed for wheezing or shortness of breath.    [provider]  amoxicillin (AMOXIL) 875 MG tablet Take 1 tablet (875 mg total) by mouth 2 (two) times daily. 01/10/18   Carlean Jews, NP  baclofen (LIORESAL) 10 MG tablet TK 1 T PO TID PRN 02/18/17   [provider]  busPIRone (BUSPAR) 5 MG tablet Take 1 tablet (5 mg total) by mouth 3 (three) times daily. 11/26/17   Carlean Jews, NP  clarithromycin (BIAXIN) 500 MG tablet Take 1 tablet (500 mg total) by mouth 2 (two) times daily. 01/10/18   Carlean Jews, NP  dicyclomine (BENTYL) 20 MG tablet Take 1 tablet (20 mg total) by mouth 3 (three) times daily as needed for spasms. 01/24/18 01/24/19  Merrily Brittle, MD  FLUoxetine (PROZAC) 20 MG tablet Take 1 tablet (20 mg total) by mouth daily. 10/15/17   Carlean Jews, NP  nabumetone (RELAFEN) 750 MG tablet TK 1 T PO Q 12 H PRF ACHING AND STIFFNESS 02/24/17   [provider]  omeprazole (PRILOSEC) 40 MG capsule Take 1 capsule (40 mg total) by mouth daily. 12/21/17   Carlean Jews, NP  ondansetron (ZOFRAN ODT) 4 MG disintegrating tablet Take 1 tablet (4 mg total) by mouth every 8 (eight) hours as needed for nausea or vomiting. 01/24/18   Merrily Brittle, MD  sucralfate (CARAFATE) 1 g tablet Take 1 tablet (1 g total) by mouth 4 (four) times daily for  15 days. 12/17/17 01/01/18  Jene EveryKinner, Robert, MD    Allergies Patient has no known allergies.  Family History  Problem Relation Age of Onset  . Rheum arthritis Mother   . Cancer Maternal Uncle   . Seizures Maternal Uncle   . Breast cancer Maternal Grandmother   . Diabetes Maternal Grandmother   . Stroke Maternal Grandmother   . Cancer Paternal Grandfather     Social History Social History   Tobacco Use  . Smoking status: Never Smoker  . Smokeless tobacco: Never Used  Substance Use Topics  . Alcohol use: Yes    Comment: socially  . Drug use: No     Review of Systems Constitutional: No fever/chills Eyes: No visual changes. ENT: No sore throat. Cardiovascular: Denies chest pain. Respiratory: Denies shortness of breath. Gastrointestinal: Positive for abdominal pain.  Positive for nausea, no vomiting.  No diarrhea.  No constipation. Genitourinary: Negative for dysuria. Musculoskeletal: Negative for back pain. Skin: Negative for rash. Neurological: Negative for headaches, focal weakness or numbness.   ____________________________________________   PHYSICAL EXAM:  VITAL SIGNS: ED Triage Vitals  Enc Vitals Group     BP 01/24/18 0128 (!) 141/78     Pulse Rate 01/24/18 0128 92     Resp 01/24/18 0128 18     Temp 01/24/18 0128 98.1 F (36.7 C)     Temp Source 01/24/18 0128 Oral     SpO2 01/24/18 0128 100 %     Weight 01/24/18 0125 255 lb (115.7 kg)     Height 01/24/18 0125 5\' 4"  (1.626 m)     Head Circumference --      Peak Flow --      Pain Score 01/24/18 0124 8     Pain Loc --      Pain Edu? --      Excl. in GC? --     Constitutional: Alert and oriented x4 appears obviously uncomfortable although nontoxic no diaphoresis speaks in full clear sentences Eyes: PERRL EOMI. Head: Atraumatic. Nose: No congestion/rhinnorhea. Mouth/Throat: No trismus Neck: No stridor.   Cardiovascular: Normal rate, regular rhythm. Grossly normal heart sounds.  Good peripheral circulation. Respiratory: Normal respiratory effort.  No retractions. Lungs CTAB and moving good air Gastrointestinal: Soft abdomen diffuse upper abdominal tenderness with no rebound or guarding no peritonitis no McBurney's tenderness and Rovsing's no costovertebral tenderness Musculoskeletal: No lower extremity edema   Neurologic:  Normal speech and language. No gross focal neurologic deficits are appreciated. Skin:  Skin is warm, dry and intact. No rash noted. Psychiatric: Mood and affect are normal. Speech and behavior are  normal.    ____________________________________________   DIFFERENTIAL includes but not limited to  Appendicitis, diverticulitis, pyelonephritis, nephrolithiasis, food poisoning ____________________________________________   LABS (all labs ordered are listed, but only abnormal results are displayed)  Labs Reviewed  COMPREHENSIVE METABOLIC PANEL - Abnormal; Notable for the following components:      Result Value   Glucose, Bld 130 (*)    All other components within normal limits  CBC WITH DIFFERENTIAL/PLATELET - Abnormal; Notable for the following components:   Hemoglobin 10.9 (*)    HCT 34.5 (*)    MCV 74.8 (*)    MCH 23.6 (*)    RDW 15.6 (*)    All other components within normal limits  LIPASE, BLOOD  I-STAT BETA HCG BLOOD, ED (NOT ORDERABLE)    Lab work reviewed by me with chronically low hemoglobin otherwise unremarkable __________________________________________  EKG   ____________________________________________  RADIOLOGY  CT abdomen pelvis reviewed by me with likely mesenteric adenitis ____________________________________________   PROCEDURES  Procedure(s) performed: no  Procedures  Critical Care performed: no  ____________________________________________   INITIAL IMPRESSION / ASSESSMENT AND PLAN / ED COURSE  Pertinent labs & imaging results that were available during my care of the patient were reviewed by me and considered in my medical decision making (see chart for details).   As part of my medical decision making, I reviewed the following data within the electronic MEDICAL RECORD NUMBER History obtained from family if available, nursing notes, old chart and ekg, as well as notes from prior ED visits.  The patient comes to the emergency department with sudden onset moderate to severe cramping abdominal discomfort after eating sushi.  No diarrhea.  No vomiting but she does have nausea.  Given her morbid obesity I sent her to CT scan without IV or  oral contrast is that should be adequate to show any significant stranding.  Lab work is pending.  Fortunately the patient's CT scan is negative for acute surgical or bacterial etiology of her symptoms.  It is consistent with mesenteric adenitis.  Her primary concern is cramping.  I gave her IV haloperidol for nausea as well as intramuscular Bentyl for the cramping and hyoscamine.  She feels nearly completely resolved after treatment and IV fluids.  I will give her a short course of Bentyl for home along with Zofran for nausea.  Strict return precautions been given.      ____________________________________________   FINAL CLINICAL IMPRESSION(S) / ED DIAGNOSES  Final diagnoses:  Acute mesenteric adenitis  Abdominal cramping      NEW MEDICATIONS STARTED DURING THIS VISIT:  Discharge Medication List as of 01/24/2018  3:02 AM    START taking these medications   Details  dicyclomine (BENTYL) 20 MG tablet Take 1 tablet (20 mg total) by mouth 3 (three) times daily as needed for spasms., Starting Mon 01/24/2018, Until Tue 01/24/2019, Print    ondansetron (ZOFRAN ODT) 4 MG disintegrating tablet Take 1 tablet (4 mg total) by mouth every 8 (eight) hours as needed for nausea or vomiting., Starting Mon 01/24/2018, Print         Note:  This document was prepared using Dragon voice recognition software and may include unintentional dictation errors.     Merrily Brittle, MD 01/24/18 [phone]

## 2018-01-24 NOTE — ED Notes (Signed)
Patient c/o left abdominal pain. Patient describes to upper pain as a tightness, and the lower pain as a burning. Patient c/o nausea, denies emesis. Patient c/o diarrhea - reports 2 liquid stools since onset of pain. Patient is tender to palpation of abdomen. Patient reports similar symptoms in the past; patient was dx with gastritis.

## 2018-01-24 NOTE — ED Notes (Signed)
Patient transported to CT 

## 2018-01-24 NOTE — ED Notes (Signed)
Reviewed discharge instructions, follow-up care, and prescriptions with patient. Patient verbalized understanding of all information reviewed. Patient stable, with no distress noted at this time.    

## 2018-01-31 ENCOUNTER — Other Ambulatory Visit: Payer: Self-pay

## 2018-01-31 ENCOUNTER — Ambulatory Visit: Payer: Self-pay | Admitting: Nurse Practitioner

## 2018-01-31 MED ORDER — ALBUTEROL SULFATE HFA 108 (90 BASE) MCG/ACT IN AERS: INHALATION_SPRAY | RESPIRATORY_TRACT | 3 refills | Status: DC

## 2018-03-17 ENCOUNTER — Other Ambulatory Visit: Payer: Self-pay | Admitting: Nurse Practitioner

## 2018-03-17 ENCOUNTER — Ambulatory Visit: Payer: Self-pay | Admitting: Nurse Practitioner

## 2018-03-17 ENCOUNTER — Encounter: Payer: Self-pay | Admitting: Nurse Practitioner

## 2018-03-17 VITALS — BP 118/81 | HR 87 | Resp 16 | Ht 63.75 in | Wt 256.0 lb

## 2018-03-17 DIAGNOSIS — N926 Irregular menstruation, unspecified: Secondary | ICD-10-CM

## 2018-03-17 DIAGNOSIS — N921 Excessive and frequent menstruation with irregular cycle: Secondary | ICD-10-CM

## 2018-03-17 NOTE — Progress Notes (Signed)
[facility] [address] [place], [place] [address]  Internal MEDICINE  Office Visit Note  Patient Name: Rebecca Simmons  [id number]  [id number]  Date of Service: 03/18/2018   Pt is here for a sick visit.  Chief Complaint  Patient presents with  . Vaginal Bleeding    pt is experiencing bleeding after her period was over, she has experienced this for 2 months in a row, last period was March 02, 2018, this month it has been bleeding like she was having another round of her period, she noticed blood clot coming out after period, she noticed she have clots during her cycle. today is just dry blood no flow. noo pain with the bleding after her cycle.     The patient states that she has been having bleeding in between her menstrual cycles. Has been going on for past few months. Initially, was like spotting. Has gradually become worse. Will happen about 2 weeks after her regular menstrual cycle has ended. Flow is heavy, saturating a maxi-pad in about an hour. Does not have a lot of cramping associated with this influenza addition, he menstrual cycles have become very heavy. She passes a great deal of clots and the bleeding is heavy for about 5 days. Again, her cramping is minimal. She denies chance of pregnancy. Has not had intercourse of any type in several months. In novermber, she went to the ER for similar symptoms. She had a CT scan of her abdomen and pelvis. This indicated lymph node enlargement in the pelvis, but was otherwise, without acute abnormalities.   Vaginal Bleeding  The patient's primary symptoms include pelvic pain. The patient's pertinent negatives include no vaginal discharge. Pertinent negatives include no abdominal pain, back pain, chills, constipation, diarrhea, dysuria, frequency, headaches, nausea, rash, sore throat or vomiting.        Current Medication:  Outpatient Encounter Medications as of 03/17/2018  Medication Sig  . albuterol (PROAIR HFA) 108  (90 Base) MCG/ACT inhaler Inhale 1 to 2 puff by po 4 times daily as needed for wheezing/cough  . FLUoxetine (PROZAC) 20 MG tablet Take 1 tablet (20 mg total) by mouth daily.  . nabumetone (RELAFEN) 750 MG tablet TK 1 T PO Q 12 H PRF ACHING AND STIFFNESS  . omeprazole (PRILOSEC) 40 MG capsule Take 1 capsule (40 mg total) by mouth daily.  Marland Kitchen amoxicillin (AMOXIL) 875 MG tablet Take 1 tablet (875 mg total) by mouth 2 (two) times daily. (Patient not taking: Reported on 03/17/2018)  . baclofen (LIORESAL) 10 MG tablet TK 1 T PO TID PRN  . busPIRone (BUSPAR) 5 MG tablet Take 1 tablet (5 mg total) by mouth 3 (three) times daily. (Patient not taking: Reported on 03/17/2018)  . clarithromycin (BIAXIN) 500 MG tablet Take 1 tablet (500 mg total) by mouth 2 (two) times daily. (Patient not taking: Reported on 03/17/2018)  . dicyclomine (BENTYL) 20 MG tablet Take 1 tablet (20 mg total) by mouth 3 (three) times daily as needed for spasms. (Patient not taking: Reported on 03/17/2018)  . ondansetron (ZOFRAN ODT) 4 MG disintegrating tablet Take 1 tablet (4 mg total) by mouth every 8 (eight) hours as needed for nausea or vomiting. (Patient not taking: Reported on 03/17/2018)  . sucralfate (CARAFATE) 1 g tablet Take 1 tablet (1 g total) by mouth 4 (four) times daily for 15 days.   No facility-administered encounter medications on file as of 03/17/2018.       Medical History: Past Medical History:  Diagnosis  Date  . Anxiety   . Asthma   . Depression      Today's Vitals   03/17/18 1535  BP: 118/81  Pulse: 87  Resp: 16  SpO2: 99%  Weight: 256 lb (116.1 kg)  Height: 5' 3.75" (1.619 m)    Review of Systems  Constitutional: Positive for activity change and fatigue. Negative for chills and unexpected weight change.  HENT: Positive for postnasal drip. Negative for congestion, rhinorrhea, sneezing and sore throat.   Respiratory: Negative for cough, chest tightness, shortness of breath and wheezing.    Cardiovascular: Negative for chest pain and palpitations.  Gastrointestinal: Negative for abdominal pain, constipation, diarrhea, nausea and vomiting.  Genitourinary: Positive for menstrual problem, pelvic pain and vaginal bleeding. Negative for dysuria, frequency and vaginal discharge.  Musculoskeletal: Negative for arthralgias, back pain, joint swelling, myalgias and neck pain.  Skin: Negative for rash.  Allergic/Immunologic: Negative for environmental allergies.  Neurological: Negative for dizziness, tremors, numbness and headaches.  Hematological: Negative for adenopathy. Does not bruise/bleed easily.  Psychiatric/Behavioral: Negative for behavioral problems (Depression), sleep disturbance and suicidal ideas. The patient is not nervous/anxious.     Physical Exam Vitals signs and nursing note reviewed.  Constitutional:      General: She is not in acute distress.    Appearance: She is well-developed. She is obese. She is not diaphoretic.  HENT:     Head: Normocephalic and atraumatic.     Mouth/Throat:     Pharynx: No oropharyngeal exudate.  Eyes:     Pupils: Pupils are equal, round, and reactive to light.  Neck:     Musculoskeletal: Normal range of motion and neck supple.     Thyroid: No thyromegaly.     Vascular: No JVD.     Trachea: No tracheal deviation.  Cardiovascular:     Rate and Rhythm: Normal rate and regular rhythm.     Heart sounds: Normal heart sounds. No murmur. No friction rub. No gallop.   Pulmonary:     Effort: Pulmonary effort is normal. No respiratory distress.     Breath sounds: Normal breath sounds. No wheezing or rales.  Chest:     Chest wall: No tenderness.  Abdominal:     General: Bowel sounds are normal. There is no distension.     Palpations: Abdomen is soft. There is no mass.     Tenderness: There is abdominal tenderness. There is no guarding.     Hernia: No hernia is present.  Musculoskeletal: Normal range of motion.  Lymphadenopathy:      Cervical: No cervical adenopathy.  Skin:    General: Skin is warm and dry.  Neurological:     Mental Status: She is alert and oriented to person, place, and time.     Cranial Nerves: No cranial nerve deficit.  Psychiatric:        Behavior: Behavior normal.        Thought Content: Thought content normal.        Judgment: Judgment normal.   Assessment/Plan: 1. Menorrhagia with irregular cycle Will get transvaginal u/s for further evaluation. Check reproductive hormones and thyroid panel and discuss results and treatment plan at her next visit.  - US Transvaginal Non-OB; Future  2. Irregular menstrual cycle Discussed initiation of oral contraceptive pills to help regularte the menstrual cycle. The patient does not wish to try this option.  Will get transvaginal u/s for further evaluation. Check reproductive hormones and thyroid panel and discuss results and treatment plan at her next  visit.    General Counseling: Maysa verbalizes understanding of the findings of todays visit and agrees with plan of treatment. I have discussed any further diagnostic evaluation that may be needed or ordered today. We also reviewed her medications today. she has been encouraged to call the office with any questions or concerns that should arise related to todays visit.    Counseling:  This patient was seen by Vincent Gros FNP Collaboration with Dr Lyndon Code as a part of collaborative care agreement  Orders Placed This Encounter  Procedures  . US Transvaginal Non-OB    Time spent: 25 Minutes

## 2018-03-18 DIAGNOSIS — N921 Excessive and frequent menstruation with irregular cycle: Secondary | ICD-10-CM | POA: Insufficient documentation

## 2018-03-18 DIAGNOSIS — N926 Irregular menstruation, unspecified: Secondary | ICD-10-CM | POA: Insufficient documentation

## 2018-03-22 ENCOUNTER — Ambulatory Visit
Admission: RE | Admit: 2018-03-22 | Discharge: 2018-03-22 | Disposition: A | Discharge status: Home / Self Care | Payer: Self-pay | Source: Ambulatory Visit | Attending: Nurse Practitioner | Admitting: Nurse Practitioner

## 2018-03-22 DIAGNOSIS — N926 Irregular menstruation, unspecified: Secondary | ICD-10-CM | POA: Insufficient documentation

## 2018-03-22 DIAGNOSIS — N921 Excessive and frequent menstruation with irregular cycle: Secondary | ICD-10-CM | POA: Insufficient documentation

## 2018-03-28 ENCOUNTER — Other Ambulatory Visit: Payer: Self-pay | Admitting: Nurse Practitioner

## 2018-03-28 DIAGNOSIS — F331 Major depressive disorder, recurrent, moderate: Secondary | ICD-10-CM

## 2018-03-28 MED ORDER — FLUOXETINE HCL 20 MG PO TABS: 20.0000 mg | ORAL_TABLET | Freq: Every day | ORAL | 3 refills | Status: DC

## 2018-04-04 ENCOUNTER — Encounter: Payer: Self-pay | Admitting: Nurse Practitioner

## 2018-04-04 ENCOUNTER — Ambulatory Visit: Payer: Self-pay | Admitting: Nurse Practitioner

## 2018-04-04 VITALS — BP 118/72 | HR 81 | Resp 16 | Ht 64.75 in | Wt 255.2 lb

## 2018-04-04 DIAGNOSIS — N921 Excessive and frequent menstruation with irregular cycle: Secondary | ICD-10-CM

## 2018-04-04 DIAGNOSIS — R5383 Other fatigue: Secondary | ICD-10-CM

## 2018-04-04 NOTE — Progress Notes (Signed)
[facility] [address] [place], [place] [address]  Internal MEDICINE  Office Visit Note  Patient Name: Rebecca GoldenDominique Simmons  [birth year] 06, 1992  [id number]  Date of Service: 04/10/2018  Chief Complaint  Patient presents with  . Medical Management of Chronic Issues    follow up  . Labs Only    ultrasound and lab results    The patient states that she has been having bleeding in between her menstrual cycles. Has been going on for past few months. Initially, was like spotting. Has gradually become worse. Will happen about 2 weeks after her regular menstrual cycle has ended. Flow is heavy, saturating a maxi-pad in about an hour. Does not have a lot of cramping associated with this influenza addition, he menstrual cycles have become very heavy. She passes a great deal of clots and the bleeding is heavy for about 5 days. Again, her cramping is minimal. She denies chance of pregnancy. Has not had intercourse of any type in several months. In novermber, she went to the ER for similar symptoms. An ultrasound of pelvis was performed since her last visit. The lining of her endometrium was slightly thickened, however, this may be expected, as she started menstrual cycle within days of having her ultrasound.       Current Medication: Outpatient Encounter Medications as of 04/04/2018  Medication Sig  . albuterol (PROAIR HFA) 108 (90 Base) MCG/ACT inhaler Inhale 1 to 2 puff by po 4 times daily as needed for wheezing/cough  . FLUoxetine (PROZAC) 20 MG tablet Take 1 tablet (20 mg total) by mouth daily.  . [DISCONTINUED] amoxicillin (AMOXIL) 875 MG tablet Take 1 tablet (875 mg total) by mouth 2 (two) times daily. (Patient not taking: Reported on 03/17/2018)  . [DISCONTINUED] baclofen (LIORESAL) 10 MG tablet TK 1 T PO TID PRN  . [DISCONTINUED] busPIRone (BUSPAR) 5 MG tablet Take 1 tablet (5 mg total) by mouth 3 (three) times daily. (Patient not taking: Reported on 03/17/2018)  . [DISCONTINUED]  clarithromycin (BIAXIN) 500 MG tablet Take 1 tablet (500 mg total) by mouth 2 (two) times daily. (Patient not taking: Reported on 03/17/2018)  . [DISCONTINUED] dicyclomine (BENTYL) 20 MG tablet Take 1 tablet (20 mg total) by mouth 3 (three) times daily as needed for spasms. (Patient not taking: Reported on 03/17/2018)  . [DISCONTINUED] nabumetone (RELAFEN) 750 MG tablet TK 1 T PO Q 12 H PRF ACHING AND STIFFNESS  . [DISCONTINUED] omeprazole (PRILOSEC) 40 MG capsule Take 1 capsule (40 mg total) by mouth daily. (Patient not taking: Reported on 04/04/2018)  . [DISCONTINUED] ondansetron (ZOFRAN ODT) 4 MG disintegrating tablet Take 1 tablet (4 mg total) by mouth every 8 (eight) hours as needed for nausea or vomiting. (Patient not taking: Reported on 03/17/2018)  . [DISCONTINUED] sucralfate (CARAFATE) 1 g tablet Take 1 tablet (1 g total) by mouth 4 (four) times daily for 15 days.   No facility-administered encounter medications on file as of 04/04/2018.     Surgical History: Past Surgical History:  Procedure Laterality Date  . FACIAL RECONSTRUCTION SURGERY     @28  years of age  . TONSILLECTOMY      Medical History: Past Medical History:  Diagnosis Date  . Anxiety   . Asthma   . Depression     Family History: Family History  Problem Relation Age of Onset  . Rheum arthritis Mother   . Cancer Maternal Uncle   . Seizures Maternal Uncle   . Breast cancer Maternal Grandmother   . Diabetes  Maternal Grandmother   . Stroke Maternal Grandmother   . Cancer Paternal Grandfather     Social History   Socioeconomic History  . Marital status: Single    Spouse name: Not on file  . Number of children: Not on file  . Years of education: Not on file  . Highest education level: Not on file  Occupational History  . Not on file  Social Needs  . Financial resource strain: Not on file  . Food insecurity:    Worry: Not on file    Inability: Not on file  . Transportation needs:    Medical: Not on file     Non-medical: Not on file  Tobacco Use  . Smoking status: Never Smoker  . Smokeless tobacco: Never Used  Substance and Sexual Activity  . Alcohol use: Yes    Comment: socially  . Drug use: No  . Sexual activity: Yes    Birth control/protection: None  Lifestyle  . Physical activity:    Days per week: Not on file    Minutes per session: Not on file  . Stress: Not on file  Relationships  . Social connections:    Talks on phone: Not on file    Gets together: Not on file    Attends religious service: Not on file    Active member of club or organization: Not on file    Attends meetings of clubs or organizations: Not on file    Relationship status: Not on file  . Intimate partner violence:    Fear of current or ex partner: Not on file    Emotionally abused: Not on file    Physically abused: Not on file    Forced sexual activity: Not on file  Other Topics Concern  . Not on file  Social History Narrative  . Not on file      Review of Systems  Constitutional: Positive for activity change and fatigue. Negative for chills and unexpected weight change.  HENT: Positive for postnasal drip. Negative for congestion, rhinorrhea, sneezing and sore throat.   Respiratory: Negative for cough, chest tightness, shortness of breath and wheezing.   Cardiovascular: Negative for chest pain and palpitations.  Gastrointestinal: Negative for abdominal pain, constipation, diarrhea, nausea and vomiting.  Genitourinary: Positive for menstrual problem, pelvic pain and vaginal bleeding. Negative for dysuria, frequency and vaginal discharge.  Musculoskeletal: Negative for arthralgias, back pain, joint swelling, myalgias and neck pain.  Skin: Negative for rash.  Allergic/Immunologic: Negative for environmental allergies.  Neurological: Negative for dizziness, tremors, numbness and headaches.  Hematological: Negative for adenopathy. Does not bruise/bleed easily.  Psychiatric/Behavioral: Negative for  behavioral problems (Depression), sleep disturbance and suicidal ideas. The patient is not nervous/anxious.     Vital Signs: BP 118/72 (BP Location: Left Arm, Patient Position: Sitting, Cuff Size: Large)   Pulse 81   Resp 16   Ht 5' 4.75" (1.645 m)   Wt 255 lb 3.2 oz (115.8 kg)   SpO2 98%   BMI 42.80 kg/m    Physical Exam Vitals signs and nursing note reviewed.  Constitutional:      General: She is not in acute distress.    Appearance: She is well-developed. She is obese. She is not diaphoretic.  HENT:     Head: Normocephalic and atraumatic.     Mouth/Throat:     Pharynx: No oropharyngeal exudate.  Eyes:     Pupils: Pupils are equal, round, and reactive to light.  Neck:     Musculoskeletal:  Normal range of motion and neck supple.     Thyroid: No thyromegaly.     Vascular: No JVD.     Trachea: No tracheal deviation.  Cardiovascular:     Rate and Rhythm: Normal rate and regular rhythm.     Heart sounds: Normal heart sounds. No murmur. No friction rub. No gallop.   Pulmonary:     Effort: Pulmonary effort is normal. No respiratory distress.     Breath sounds: Normal breath sounds. No wheezing or rales.  Chest:     Chest wall: No tenderness.  Abdominal:     General: Bowel sounds are normal. There is no distension.     Palpations: Abdomen is soft. There is no mass.     Tenderness: There is abdominal tenderness. There is no guarding.     Hernia: No hernia is present.  Musculoskeletal: Normal range of motion.  Lymphadenopathy:     Cervical: No cervical adenopathy.  Skin:    General: Skin is warm and dry.  Neurological:     Mental Status: She is alert and oriented to person, place, and time.     Cranial Nerves: No cranial nerve deficit.  Psychiatric:        Behavior: Behavior normal.        Thought Content: Thought content normal.        Judgment: Judgment normal.   Assessment/Plan:  1. Menorrhagia with irregular cycle Reviewed results of pelvic ultrasound with the  patient. Did show mild increase in thickness of endometrial lining. May be normal finding, as she started her menstrual cycle a few days of having her ultrasound. Will repeat ultrasound in several months for further evaluation. reer to GYN as indicated. Offered initiation of OCP to help regular menstrual cycles and bleeding. She does not wish to try this option.   2. Other fatigue Labs ordered. Will discuss results with patient when they are available .   General Counseling: Allante verbalizes understanding of the findings of todays visit and agrees with plan of treatment. I have discussed any further diagnostic evaluation that may be needed or ordered today. We also reviewed her medications today. she has been encouraged to call the office with any questions or concerns that should arise related to todays visit.   This patient was seen by Vincent Gros FNP Collaboration with Dr Lyndon Code as a part of collaborative care agreement  Time spent: 15 Minutes      Dr Lyndon Code Internal medicine

## 2018-08-08 ENCOUNTER — Other Ambulatory Visit: Payer: Self-pay

## 2018-08-08 DIAGNOSIS — F331 Major depressive disorder, recurrent, moderate: Secondary | ICD-10-CM

## 2018-08-08 MED ORDER — FLUOXETINE HCL 20 MG PO TABS: 20.0000 mg | ORAL_TABLET | Freq: Every day | ORAL | 1 refills | Status: DC

## 2018-08-08 NOTE — Telephone Encounter (Signed)
Pt advised call us back make follow up appt in 2 months

## 2018-08-12 ENCOUNTER — Other Ambulatory Visit: Payer: Self-pay

## 2018-08-12 DIAGNOSIS — F331 Major depressive disorder, recurrent, moderate: Secondary | ICD-10-CM

## 2018-08-12 MED ORDER — FLUOXETINE HCL 20 MG PO TABS: 20.0000 mg | ORAL_TABLET | Freq: Every day | ORAL | 1 refills | Status: DC

## 2018-11-08 ENCOUNTER — Telehealth: Payer: Self-pay | Admitting: Nurse Practitioner

## 2018-11-08 ENCOUNTER — Other Ambulatory Visit: Payer: Self-pay | Admitting: Nurse Practitioner

## 2018-11-08 DIAGNOSIS — F331 Major depressive disorder, recurrent, moderate: Secondary | ICD-10-CM

## 2018-11-08 MED ORDER — FLUOXETINE HCL 20 MG PO TABS: 20.0000 mg | ORAL_TABLET | Freq: Every day | ORAL | 0 refills | Status: DC

## 2018-11-08 NOTE — Telephone Encounter (Signed)
Pt advised 30 day refill was sent to pharmacy , needs to schedule a follow up for further refills

## 2018-12-09 ENCOUNTER — Encounter: Payer: Self-pay | Admitting: Nurse Practitioner

## 2018-12-09 ENCOUNTER — Ambulatory Visit: Payer: BC Managed Care – PPO | Admitting: Nurse Practitioner

## 2018-12-09 ENCOUNTER — Other Ambulatory Visit: Payer: Self-pay

## 2018-12-09 VITALS — BP 135/93 | HR 76 | Temp 98.2°F | Resp 16 | Ht 64.0 in | Wt 258.0 lb

## 2018-12-09 DIAGNOSIS — N926 Irregular menstruation, unspecified: Secondary | ICD-10-CM

## 2018-12-09 DIAGNOSIS — G5601 Carpal tunnel syndrome, right upper limb: Secondary | ICD-10-CM

## 2018-12-09 DIAGNOSIS — F331 Major depressive disorder, recurrent, moderate: Secondary | ICD-10-CM

## 2018-12-09 LAB — POCT URINE PREGNANCY: Preg Test, Ur: NEGATIVE

## 2018-12-09 MED ORDER — FLUOXETINE HCL 20 MG PO CAPS: 20.0000 mg | ORAL_CAPSULE | Freq: Every day | ORAL | 0 refills | Status: DC

## 2018-12-09 MED ORDER — MELOXICAM 15 MG PO TABS: 15.0000 mg | ORAL_TABLET | Freq: Every day | ORAL | 3 refills | Status: DC

## 2018-12-09 MED ORDER — FLUOXETINE HCL 20 MG PO TABS: 20.0000 mg | ORAL_TABLET | Freq: Every day | ORAL | 0 refills | Status: DC

## 2018-12-09 NOTE — Progress Notes (Signed)
[facility] [address] [place], [place] [address]  Internal MEDICINE  Office Visit Note  Patient Name: Rebecca Simmons  [id number]  [id number]  Date of Service: 12/18/2018  Chief Complaint  Patient presents with  . Follow-up  . Carpal Tunnel  . Gastroesophageal Reflux  . Medication Refill    fluoxetine  . Possible Pregnancy    period was supposed to come on the 1st and also took test at the beginning of this month and it was negative, would like to discuss with you before doing a test  . Depression    pt states she has been feeling depressed, asked if there were any thoughts of hurting herself and pt states over the past two weeks no    The patient is here for routine follow up. She is working from home now. 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 She is having increased right wrist pain. Has seen orthopedics in the past and was treated for carel tunnel. She takes anti-inflammatory as needed for more severe pain.  She states that she is lat for her menstrual cycle. Her last period started 11/04/2018. She did take a home pregnancy test and it was negative. This was October 1, prior than the date to miss her period.  She takes fluoxetine every day to help with depression and anxiety. This is working well and she does need to have refills for this.       Current Medication: Outpatient Encounter Medications as of 12/09/2018  Medication Sig  . albuterol (PROAIR HFA) 108 (90 Base) MCG/ACT inhaler Inhale 1 to 2 puff by po 4 times daily as needed for wheezing/cough  . Magnesium 500 MG TABS Take by mouth daily.  . Multiple Vitamins-Minerals (MULTIVITAMIN ADULT) CHEW Chew by mouth daily.  Marland Kitchen OMEPRAZOLE PO Take 20 mg by mouth daily.  . [DISCONTINUED] FLUoxetine (PROZAC) 20 MG tablet Take 1 tablet (20 mg total) by mouth daily.  . [DISCONTINUED] FLUoxetine (PROZAC) 20 MG tablet Take 1 tablet (20 mg total) by mouth  daily.  . meloxicam (MOBIC) 15 MG tablet Take 1 tablet (15 mg total) by mouth daily.   No facility-administered encounter medications on file as of 12/09/2018.     Surgical History: Past Surgical History:  Procedure Laterality Date  . FACIAL RECONSTRUCTION SURGERY     @28  years of age  . TONSILLECTOMY      Medical History: Past Medical History:  Diagnosis Date  . Anxiety   . Asthma   . Depression     Family History: Family History  Problem Relation Age of Onset  . Rheum arthritis Mother   . Cancer Maternal Uncle   . Seizures Maternal Uncle   . Breast cancer Maternal Grandmother   . Diabetes Maternal Grandmother   . Stroke Maternal Grandmother   . Cancer Paternal Grandfather     Social History   Socioeconomic History  . Marital status: Single    Spouse name: Not on file  . Number of children: Not on file  . Years of education: Not on file  . Highest education level: Not on file  Occupational History  . Not on file  Social Needs  . Financial resource strain: Not on file  . Food insecurity    Worry: Not on file    Inability: Not on file  . Transportation needs    Medical: Not on file    Non-medical: Not on file  Tobacco Use  . Smoking status: Never Smoker  . Smokeless tobacco: Never Used  Substance and Sexual Activity  . Alcohol use: Yes    Comment: socially  . Drug use: No  . Sexual activity: Yes    Birth control/protection: None  Lifestyle  . Physical activity    Days per week: Not on file    Minutes per session: Not on file  . Stress: Not on file  Relationships  . Social Musicianconnections    Talks on phone: Not on file    Gets together: Not on file    Attends religious service: Not on file    Active member of club or organization: Not on file    Attends meetings of clubs or organizations: Not on file    Relationship status: Not on file  . Intimate partner violence    Fear of current or ex partner: Not on file    Emotionally abused: Not on file     Physically abused: Not on file    Forced sexual activity: Not on file  Other Topics Concern  . Not on file  Social History Narrative  . Not on file      Review of Systems  Constitutional: Negative for activity change, chills, fatigue and unexpected weight change.  HENT: Negative for congestion, postnasal drip, rhinorrhea, sneezing and sore throat.   Respiratory: Negative for cough, chest tightness, shortness of breath and wheezing.   Cardiovascular: Negative for chest pain and palpitations.  Gastrointestinal: Negative for abdominal pain, constipation, diarrhea, nausea and vomiting.  Endocrine: Negative for cold intolerance, heat intolerance, polydipsia and polyuria.  Genitourinary: Positive for menstrual problem. Negative for dysuria and frequency.       Menstrual cycle several days late.   Musculoskeletal: Negative for arthralgias, back pain, joint swelling and neck pain.       Right wrist pain. Hurts after typing for long periods. Hurts to pronate the wrist.   Skin: Negative for rash.  Neurological: Negative for dizziness, tremors, numbness and headaches.  Hematological: Negative for adenopathy. Does not bruise/bleed easily.  Psychiatric/Behavioral: Positive for dysphoric mood. Negative for behavioral problems (Depression), sleep disturbance and suicidal ideas. The patient is nervous/anxious.    Today's Vitals   12/09/18 0957  BP: (!) 135/93  Pulse: 76  Resp: 16  Temp: 98.2 F (36.8 C)  SpO2: 96%  Weight: 258 lb (117 kg)  Height: 5\' 4"  (1.626 m)   Body mass index is 44.29 kg/m.  Physical Exam Vitals signs and nursing note reviewed.  Constitutional:      General: She is not in acute distress.    Appearance: Normal appearance. She is well-developed. She is obese. She is not diaphoretic.  HENT:     Head: Normocephalic and atraumatic.     Mouth/Throat:     Pharynx: No oropharyngeal exudate.  Eyes:     Pupils: Pupils are equal, round, and reactive to light.  Neck:      Musculoskeletal: Normal range of motion and neck supple.     Thyroid: No thyromegaly.     Vascular: No JVD.     Trachea: No tracheal deviation.  Cardiovascular:     Rate and Rhythm: Normal rate and regular rhythm.     Heart sounds: Normal heart sounds. No murmur. No friction rub. No gallop.   Pulmonary:     Effort: Pulmonary effort is normal. No respiratory distress.     Breath sounds: Normal breath sounds. No wheezing or rales.  Chest:  Chest wall: No tenderness.  Abdominal:     General: Bowel sounds are normal.     Palpations: Abdomen is soft.     Tenderness: There is no abdominal tenderness.  Genitourinary:    Comments: Urine pregnancy negative today Musculoskeletal: Normal range of motion.     Comments: Bilateral wrist tenderness, more severe on right side. Pronation and supination f the right hand increase pain. No swelling or bony deformity appreciated at this time.   Lymphadenopathy:     Cervical: No cervical adenopathy.  Skin:    General: Skin is warm and dry.  Neurological:     Mental Status: She is alert and oriented to person, place, and time.     Cranial Nerves: No cranial nerve deficit.  Psychiatric:        Behavior: Behavior normal.        Thought Content: Thought content normal.        Judgment: Judgment normal.   Assessment/Plan: 1. Acute carpal tunnel syndrome of right wrist Add back meloxicam 15mg  daily as needed for pain/inflammation. Recommended she use sbilateral wrist splints for carpel tunnel at night. Ice wrists at the end of the day to reduce inflammation. Refer to orthopedics for continued evaluation and treatment. - Ambulatory referral to Orthopedic Surgery - meloxicam (MOBIC) 15 MG tablet; Take 1 tablet (15 mg total) by mouth daily.  Dispense: 30 tablet; Refill: 3  2. Major depressive disorder, recurrent episode, moderate (HCC) Stable. Continue prozac as prescribed   3. Irregular menstrual cycle Menstrual cycle is six days late. Pregnancy test  negative in the office today. Will repeat in 1 week if menstrual cycle continues to be absent.  - POCT urine pregnancy  General Counseling: Soledad verbalizes understanding of the findings of todays visit and agrees with plan of treatment. I have discussed any further diagnostic evaluation that may be needed or ordered today. We also reviewed her medications today. she has been encouraged to call the office with any questions or concerns that should arise related to todays visit.  This patient was seen by Leretha Pol FNP Collaboration with Dr Lavera Guise as a part of collaborative care agreement  Orders Placed This Encounter  Procedures  . Ambulatory referral to Orthopedic Surgery  . POCT urine pregnancy    Meds ordered this encounter  Medications  . DISCONTD: FLUoxetine (PROZAC) 20 MG tablet    Sig: Take 1 tablet (20 mg total) by mouth daily.    Dispense:  30 tablet    Refill:  0    Patient will need appt for further refills    Order Specific Question:   Supervising Provider    Answer:   Lavera Guise [[id number]]  . meloxicam (MOBIC) 15 MG tablet    Sig: Take 1 tablet (15 mg total) by mouth daily.    Dispense:  30 tablet    Refill:  3    Order Specific Question:   Supervising Provider    Answer:   Lavera Guise [[id number]]    Time spent: 56 Minutes      Dr Lavera Guise Internal medicine

## 2018-12-18 DIAGNOSIS — G5601 Carpal tunnel syndrome, right upper limb: Secondary | ICD-10-CM | POA: Insufficient documentation

## 2019-01-06 ENCOUNTER — Other Ambulatory Visit: Payer: Self-pay | Admitting: Nurse Practitioner

## 2019-01-13 ENCOUNTER — Other Ambulatory Visit: Payer: Self-pay | Admitting: Nurse Practitioner

## 2019-01-13 MED ORDER — FLUOXETINE HCL 20 MG PO CAPS: 20.0000 mg | ORAL_CAPSULE | Freq: Every day | ORAL | 1 refills | Status: DC
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
## 2019-03-09 ENCOUNTER — Other Ambulatory Visit: Payer: Self-pay

## 2019-03-09 MED ORDER — FLUOXETINE HCL 20 MG PO CAPS: 20.0000 mg | ORAL_CAPSULE | Freq: Every day | ORAL | 0 refills | Status: DC

## 2019-03-23 ENCOUNTER — Telehealth: Payer: Self-pay

## 2019-03-23 NOTE — Telephone Encounter (Signed)
CONFIRMED AND SCREENED FOR 03-28-19 OV 

## 2019-03-28 ENCOUNTER — Encounter: Payer: Self-pay | Admitting: Nurse Practitioner

## 2019-03-28 ENCOUNTER — Other Ambulatory Visit: Payer: Self-pay

## 2019-03-28 ENCOUNTER — Ambulatory Visit (INDEPENDENT_AMBULATORY_CARE_PROVIDER_SITE_OTHER): Payer: BC Managed Care – PPO | Admitting: Nurse Practitioner

## 2019-03-28 VITALS — BP 134/84 | HR 88 | Temp 97.5°F | Resp 16 | Ht 64.0 in | Wt 265.8 lb

## 2019-03-28 DIAGNOSIS — R300 Dysuria: Secondary | ICD-10-CM | POA: Diagnosis not present

## 2019-03-28 DIAGNOSIS — N921 Excessive and frequent menstruation with irregular cycle: Secondary | ICD-10-CM | POA: Diagnosis not present

## 2019-03-28 DIAGNOSIS — F331 Major depressive disorder, recurrent, moderate: Secondary | ICD-10-CM

## 2019-03-28 DIAGNOSIS — Z0001 Encounter for general adult medical examination with abnormal findings: Secondary | ICD-10-CM | POA: Diagnosis not present

## 2019-03-28 DIAGNOSIS — G5601 Carpal tunnel syndrome, right upper limb: Secondary | ICD-10-CM

## 2019-03-28 MED ORDER — MELOXICAM 15 MG PO TABS: 15.0000 mg | ORAL_TABLET | Freq: Every day | ORAL | 5 refills | Status: DC

## 2019-03-28 MED ORDER — FLUOXETINE HCL 20 MG PO CAPS: 20.0000 mg | ORAL_CAPSULE | Freq: Every day | ORAL | 5 refills | Status: DC

## 2019-03-28 NOTE — Progress Notes (Signed)
[facility] [address] [place], [place] [address]  Internal MEDICINE  Office Visit Note  Patient Name: Rebecca Simmons  [id number]  [id number]  Date of Service: 03/29/2019   Pt is here for routine health maintenance examination  Chief Complaint  Patient presents with  . Annual Exam  . Depression  . Anxiety  . Asthma     The patient is here for health maintenance exam. She states that she recently saw GYN provider. Started having heavy and persistent menstrual bleeding. Started the first full week of January. She has been started on progesterone for 15 days. She will have pelvic ultrasound when bleeding started again.  She did have labs done as well as a pap smear. She does have some increased anxiety due to pandemic and family stress. Overall, she is doing well. She is managing her anxiety and depression well on current dose of fluoxetine. She does need to have refills for this today.    Current Medication: Outpatient Encounter Medications as of 03/28/2019  Medication Sig  . albuterol (PROAIR HFA) 108 (90 Base) MCG/ACT inhaler Inhale 1 to 2 puff by po 4 times daily as needed for wheezing/cough  . FLUoxetine (PROZAC) 20 MG capsule Take 1 capsule (20 mg total) by mouth daily.  . Magnesium 500 MG TABS Take by mouth daily.  . medroxyPROGESTERone (PROVERA) 10 MG tablet Take 10 mg by mouth daily. For 15 days  . meloxicam (MOBIC) 15 MG tablet Take 1 tablet (15 mg total) by mouth daily.  . Multiple Vitamins-Minerals (MULTIVITAMIN ADULT) CHEW Chew by mouth daily.  Marland Kitchen OMEPRAZOLE PO Take 20 mg by mouth daily.  . [DISCONTINUED] FLUoxetine (PROZAC) 20 MG capsule Take 1 capsule (20 mg total) by mouth daily.  . [DISCONTINUED] meloxicam (MOBIC) 15 MG tablet Take 1 tablet (15 mg total) by mouth daily.   No facility-administered encounter medications on file as of 03/28/2019.    Surgical History: Past Surgical History:  Procedure Laterality Date  . FACIAL RECONSTRUCTION SURGERY      @29  years of age  . TONSILLECTOMY      Medical History: Past Medical History:  Diagnosis Date  . Anxiety   . Asthma   . Depression     Family History: Family History  Problem Relation Age of Onset  . Rheum arthritis Mother   . Cancer Maternal Uncle   . Seizures Maternal Uncle   . Breast cancer Maternal Grandmother   . Diabetes Maternal Grandmother   . Stroke Maternal Grandmother   . Cancer Paternal Grandfather       Review of Systems  Constitutional: Negative for activity change, chills, fatigue and unexpected weight change.  HENT: Negative for congestion, postnasal drip, rhinorrhea, sneezing and sore throat.   Respiratory: Negative for cough, chest tightness, shortness of breath and wheezing.   Cardiovascular: Negative for chest pain and palpitations.  Gastrointestinal: Negative for abdominal pain, constipation, diarrhea, nausea and vomiting.  Endocrine: Negative for cold intolerance, heat intolerance, polydipsia and polyuria.  Genitourinary: Positive for menstrual problem. Negative for dysuria and frequency.       Patient had initial visit with GYN provider earlier this month as she continues to have heavy and prolonged menstrual cycles.   Musculoskeletal: Negative for arthralgias, back pain, joint swelling and neck pain.       Right wrist pain. Hurts after typing for long periods. Hurts to pronate the wrist.   Skin: Negative for rash.  Neurological: Negative for dizziness, tremors, numbness and headaches.  Hematological: Negative  for adenopathy. Does not bruise/bleed easily.  Psychiatric/Behavioral: Positive for dysphoric mood. Negative for behavioral problems (Depression), sleep disturbance and suicidal ideas. The patient is nervous/anxious.      Today's Vitals   03/28/19 1048  BP: 134/84  Pulse: 88  Resp: 16  Temp: (!) 97.5 F (36.4 C)  SpO2: 97%  Weight: 265 lb 12.8 oz (120.6 kg)  Height: 5\' 4"  (1.626 m)   Body mass index is 45.62 kg/m.  Physical  Exam Vitals and nursing note reviewed.  Constitutional:      General: She is not in acute distress.    Appearance: Normal appearance. She is well-developed. She is obese. She is not diaphoretic.  HENT:     Head: Normocephalic and atraumatic.     Mouth/Throat:     Pharynx: No oropharyngeal exudate.  Eyes:     Conjunctiva/sclera: Conjunctivae normal.     Pupils: Pupils are equal, round, and reactive to light.  Neck:     Thyroid: No thyromegaly.     Vascular: No JVD.     Trachea: No tracheal deviation.  Cardiovascular:     Rate and Rhythm: Normal rate and regular rhythm.     Pulses: Normal pulses.     Heart sounds: Normal heart sounds. No murmur. No friction rub. No gallop.   Pulmonary:     Effort: Pulmonary effort is normal. No respiratory distress.     Breath sounds: Normal breath sounds. No wheezing or rales.  Chest:     Chest wall: No tenderness.     Breasts:        Right: Normal. No swelling, bleeding, inverted nipple, mass, nipple discharge, skin change or tenderness.        Left: Normal. No swelling, bleeding, inverted nipple, mass, nipple discharge, skin change or tenderness.  Abdominal:     General: Bowel sounds are normal.     Palpations: Abdomen is soft.     Tenderness: There is no abdominal tenderness.  Musculoskeletal:        General: Normal range of motion.     Cervical back: Normal range of motion and neck supple.     Comments: Bilateral wrist tenderness, more severe on right side. Pronation and supination f the right hand increase pain. No swelling or bony deformity appreciated at this time.   Lymphadenopathy:     Cervical: No cervical adenopathy.     Upper Body:     Right upper body: No axillary adenopathy.     Left upper body: No axillary adenopathy.  Skin:    General: Skin is warm and dry.  Neurological:     Mental Status: She is alert and oriented to person, place, and time.     Cranial Nerves: No cranial nerve deficit.  Psychiatric:        Attention  and Perception: Attention and perception normal.        Mood and Affect: Affect normal. Mood is anxious.        Speech: Speech normal.        Behavior: Behavior normal. Behavior is cooperative.        Thought Content: Thought content normal.        Cognition and Memory: Cognition and memory normal.        Judgment: Judgment normal.    Assessment/Plan:  1. Encounter for general adult medical examination with abnormal findings Annual health maintenance exam today   2. Menorrhagia with irregular cycle Patient now seeing GYN provider. Currently on progesterone for next 2  weeks to help regular cycle. She should follow up with GYN provider as scheduled.   3. Acute carpal tunnel syndrome of right wrist May cotinue to take meloxicam 15mg  daily as needed to reduce pain and inflammation.  - meloxicam (MOBIC) 15 MG tablet; Take 1 tablet (15 mg total) by mouth daily.  Dispense: 30 tablet; Refill: 5  4. Major depressive disorder, recurrent episode, moderate (HCC) Stable. Continue prozac 20mg  daily. Refills provided today.  - FLUoxetine (PROZAC) 20 MG capsule; Take 1 capsule (20 mg total) by mouth daily.  Dispense: 30 capsule; Refill: 5  5. Dysuria - UA/M w/rflx Culture, Routine   General Counseling: Geraldine verbalizes understanding of the findings of todays visit and agrees with plan of treatment. I have discussed any further diagnostic evaluation that may be needed or ordered today. We also reviewed her medications today. she has been encouraged to call the office with any questions or concerns that should arise related to todays visit.    Counseling:  This patient was seen by FNP Collaboration with Dr as a part of collaborative care agreement  Orders Placed This Encounter  Procedures  . UA/M w/rflx Culture, Routine    Meds ordered this encounter  Medications  . FLUoxetine (PROZAC) 20 MG capsule    Sig: Take 1 capsule (20 mg total) by mouth daily.     Dispense:  30 capsule    Refill:  5    Order Specific Question:   Supervising Provider    Answer:   Vincent Gros [[id number]]  . meloxicam (MOBIC) 15 MG tablet    Sig: Take 1 tablet (15 mg total) by mouth daily.    Dispense:  30 tablet    Refill:  5    Order Specific Question:   Supervising Provider    Answer:   Lyndon Code [[id number]]    Total time spent: 45 Minutes  Time spent includes review of chart, medications, test results, and follow up plan with the patient.     Lyndon Code, MD  Internal Medicine

## 2019-03-31 NOTE — Progress Notes (Signed)
Will treat with proper antibiotics when culture and sensitivity are available.

## 2019-04-01 LAB — UA/M W/RFLX CULTURE, ROUTINE
Bilirubin, UA: NEGATIVE
Glucose, UA: NEGATIVE
Ketones, UA: NEGATIVE
Leukocytes,UA: NEGATIVE
Nitrite, UA: POSITIVE — AB
Protein,UA: NEGATIVE
Specific Gravity, UA: 1.021 (ref 1.005–1.030)
Urobilinogen, Ur: 0.2 mg/dL (ref 0.2–1.0)
pH, UA: 7 (ref 5.0–7.5)

## 2019-04-01 LAB — MICROSCOPIC EXAMINATION
Casts: NONE SEEN /lpf
RBC: >30 /hpf — AB (ref 0–2)

## 2019-04-01 LAB — URINE CULTURE, REFLEX

## 2019-04-05 ENCOUNTER — Telehealth: Payer: Self-pay

## 2019-04-05 ENCOUNTER — Other Ambulatory Visit: Payer: Self-pay | Admitting: Nurse Practitioner

## 2019-04-05 DIAGNOSIS — N390 Urinary tract infection, site not specified: Secondary | ICD-10-CM

## 2019-04-05 MED ORDER — NITROFURANTOIN MONOHYD MACRO 100 MG PO CAPS: 100.0000 mg | ORAL_CAPSULE | Freq: Two times a day (BID) | ORAL | 0 refills | Status: DC

## 2019-04-05 NOTE — Progress Notes (Signed)
uti present at time of visit. Sent prescription for macrobid to her pharmacy. Should take twice daily for next 7 days. May want to take with food to prevent stomach upset.

## 2019-04-05 NOTE — Telephone Encounter (Signed)
-----   Message from Carlean Jews, NP sent at 04/05/2019  9:24 AM EST ----- Please let patient know that uti present at time of visit. Sent prescription for macrobid to her pharmacy. Should take twice daily for next 7 days. May want to take with food to prevent stomach upset.  thanks

## 2019-04-05 NOTE — Progress Notes (Signed)
Please let patient know that uti present at time of visit. Sent prescription for macrobid to her pharmacy. Should take twice daily for next 7 days. May want to take with food to prevent stomach upset.  thanks

## 2019-04-05 NOTE — Telephone Encounter (Signed)
PT WAS NOTIFIED. 

## 2019-04-10 ENCOUNTER — Telehealth: Payer: Self-pay

## 2019-04-10 NOTE — Telephone Encounter (Signed)
MAILED MEDICAL RECORDS UPON  REQUEST TO [facility].CLT

## 2019-09-06 ENCOUNTER — Other Ambulatory Visit: Payer: Self-pay

## 2019-09-06 ENCOUNTER — Ambulatory Visit
Admission: EM | Admit: 2019-09-06 | Discharge: 2019-09-06 | Disposition: A | Discharge status: Home / Self Care | Payer: BC Managed Care – PPO | Attending: Emergency Medicine | Admitting: Emergency Medicine

## 2019-09-06 DIAGNOSIS — Z23 Encounter for immunization: Secondary | ICD-10-CM | POA: Diagnosis not present

## 2019-09-06 DIAGNOSIS — T2135XA Burn of third degree of buttock, initial encounter: Secondary | ICD-10-CM | POA: Diagnosis not present

## 2019-09-06 MED ORDER — FLUCONAZOLE 200 MG PO TABS: 200.0000 mg | ORAL_TABLET | Freq: Once | ORAL | 0 refills | Status: AC

## 2019-09-06 MED ORDER — AMOXICILLIN-POT CLAVULANATE 875-125 MG PO TABS: 1.0000 | ORAL_TABLET | Freq: Two times a day (BID) | ORAL | 0 refills | Status: DC

## 2019-09-06 MED ORDER — TETANUS-DIPHTH-ACELL PERTUSSIS 5-2.5-18.5 LF-MCG/0.5 IM SUSP
0.5000 mL | Freq: Once | INTRAMUSCULAR | Status: AC
  Administered 2019-09-06: 0.5 mL via INTRAMUSCULAR
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
## 2019-09-06 NOTE — Discharge Instructions (Signed)
Keep area(s) clean and dry. Take antibiotic as prescribed with food - important to complete course. Return for worsening pain, redness, swelling, discharge, fever.  Helpful prevention tips: Keep nails short to avoid secondary skin infections. Use new, clean razors when shaving. Avoid antiperspirants - look for deodorants without aluminum. Avoid wearing underwire bras as this can irritate the area further.  
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
## 2019-09-22 ENCOUNTER — Telehealth: Payer: Self-pay

## 2019-09-22 NOTE — Telephone Encounter (Signed)
Lmom to confirm and screen for 09-26-19 ov. 

## 2019-09-26 ENCOUNTER — Telehealth: Payer: Self-pay

## 2019-09-26 ENCOUNTER — Ambulatory Visit: Payer: BC Managed Care – PPO | Admitting: Nurse Practitioner

## 2019-09-26 NOTE — Telephone Encounter (Signed)
Confirmed and screened for 09-28-19 ov. 

## 2019-09-28 ENCOUNTER — Ambulatory Visit: Payer: BC Managed Care – PPO | Admitting: Nurse Practitioner

## 2019-10-03 ENCOUNTER — Other Ambulatory Visit: Payer: Self-pay

## 2019-10-03 DIAGNOSIS — F331 Major depressive disorder, recurrent, moderate: Secondary | ICD-10-CM

## 2019-10-03 MED ORDER — FLUOXETINE HCL 20 MG PO CAPS: 20.0000 mg | ORAL_CAPSULE | Freq: Every day | ORAL | 5 refills | Status: DC

## 2019-10-13 ENCOUNTER — Ambulatory Visit: Payer: BC Managed Care – PPO | Admitting: Nurse Practitioner

## 2019-10-25 ENCOUNTER — Telehealth: Payer: Self-pay

## 2019-10-25 NOTE — Telephone Encounter (Signed)
Confirmed screened  °

## 2019-10-27 ENCOUNTER — Other Ambulatory Visit: Payer: Self-pay

## 2019-10-27 ENCOUNTER — Encounter: Payer: Self-pay | Admitting: Nurse Practitioner

## 2019-10-27 ENCOUNTER — Ambulatory Visit (INDEPENDENT_AMBULATORY_CARE_PROVIDER_SITE_OTHER): Payer: BC Managed Care – PPO | Admitting: Nurse Practitioner

## 2019-10-27 VITALS — BP 142/80 | HR 100 | Temp 97.7°F | Resp 16 | Ht 64.0 in | Wt 263.4 lb

## 2019-10-27 DIAGNOSIS — F331 Major depressive disorder, recurrent, moderate: Secondary | ICD-10-CM

## 2019-10-27 DIAGNOSIS — M19032 Primary osteoarthritis, left wrist: Secondary | ICD-10-CM

## 2019-10-27 DIAGNOSIS — M19031 Primary osteoarthritis, right wrist: Secondary | ICD-10-CM | POA: Diagnosis not present

## 2019-10-27 MED ORDER — FLUOXETINE HCL 20 MG PO CAPS: 20.0000 mg | ORAL_CAPSULE | Freq: Every day | ORAL | 5 refills | Status: DC

## 2019-10-27 NOTE — Progress Notes (Signed)
[facility] [address] [place], [place] [address]  Internal MEDICINE  Office Visit Note  Patient Name: Rebecca Simmons  [id number]  [id number]  Date of Service: 11/12/2019  Chief Complaint  Patient presents with  . Follow-up    irregular bleeding  . Depression    The patient is here for routine follow up. She continues to have problems with irregular menstrual cycles. She is seeing GYN provider at [facility] OG/GYN. GYN provider has also put the patient on metformin to help prediabetes and also with fertility complications.  The patient continues to take fluoxetine to help anxiety/depression. She is due to have refills. She states that she is doing well. She is working full time nights and doing internship in counseling. She states that she is managing anxiety/ depression and stress well.       Current Medication: Outpatient Encounter Medications as of 10/27/2019  Medication Sig  . albuterol (PROAIR HFA) 108 (90 Base) MCG/ACT inhaler Inhale 1 to 2 puff by po 4 times daily as needed for wheezing/cough  . FLUoxetine (PROZAC) 20 MG capsule Take 1 capsule (20 mg total) by mouth daily.  . Magnesium 500 MG TABS Take by mouth daily.  . meloxicam (MOBIC) 15 MG tablet Take 1 tablet (15 mg total) by mouth daily.  . metFORMIN (GLUCOPHAGE-XR) 750 MG 24 hr tablet Take 750 mg by mouth daily.  . Multiple Vitamins-Minerals (MULTIVITAMIN ADULT) CHEW Chew by mouth daily.  Marland Kitchen OMEPRAZOLE PO Take 20 mg by mouth daily.  . [DISCONTINUED] FLUoxetine (PROZAC) 20 MG capsule Take 1 capsule (20 mg total) by mouth daily.  . [DISCONTINUED] amoxicillin-clavulanate (AUGMENTIN) 875-125 MG tablet Take 1 tablet by mouth every 12 (twelve) hours.  . [DISCONTINUED] medroxyPROGESTERone (PROVERA) 10 MG tablet Take 10 mg by mouth daily. For 15 days   No facility-administered encounter medications on file as of 10/27/2019.    Surgical History: Past Surgical History:  Procedure Laterality Date  . FACIAL  RECONSTRUCTION SURGERY     @29  years of age  . TONSILLECTOMY      Medical History: Past Medical History:  Diagnosis Date  . Anxiety   . Asthma   . Depression     Family History: Family History  Problem Relation Age of Onset  . Rheum arthritis Mother   . Cancer Maternal Uncle   . Seizures Maternal Uncle   . Breast cancer Maternal Grandmother   . Diabetes Maternal Grandmother   . Stroke Maternal Grandmother   . Cancer Paternal Grandfather     Social History   Socioeconomic History  . Marital status: Single    Spouse name: Not on file  . Number of children: Not on file  . Years of education: Not on file  . Highest education level: Not on file  Occupational History  . Not on file  Tobacco Use  . Smoking status: Never Smoker  . Smokeless tobacco: Never Used  Vaping Use  . Vaping Use: Never used  Substance and Sexual Activity  . Alcohol use: Yes    Comment: socially  . Drug use: No  . Sexual activity: Yes    Birth control/protection: None  Other Topics Concern  . Not on file  Social History Narrative  . Not on file   Social Determinants of Health   Financial Resource Strain:   . Difficulty of Paying Living Expenses: Not on file  Food Insecurity:   . Worried About in the Last Year: Not on file  .  Ran Out of Food in the Last Year: Not on file  Transportation Needs:   . Lack of Transportation (Medical): Not on file  . Lack of Transportation (Non-Medical): Not on file  Physical Activity:   . Days of Exercise per Week: Not on file  . Minutes of Exercise per Session: Not on file  Stress:   . Feeling of Stress : Not on file  Social Connections:   . Frequency of Communication with Friends and Family: Not on file  . Frequency of Social Gatherings with Friends and Family: Not on file  . Attends Religious Services: Not on file  . Active Member of Clubs or Organizations: Not on file  . Attends Banker Meetings: Not on file  .  Marital Status: Not on file  Intimate Partner Violence:   . Fear of Current or Ex-Partner: Not on file  . Emotionally Abused: Not on file  . Physically Abused: Not on file  . Sexually Abused: Not on file      Review of Systems  Constitutional: Negative for activity change, chills, fatigue and unexpected weight change.  HENT: Negative for congestion, postnasal drip, rhinorrhea, sneezing and sore throat.   Respiratory: Negative for cough, chest tightness, shortness of breath and wheezing.   Cardiovascular: Negative for chest pain and palpitations.  Gastrointestinal: Negative for abdominal pain, constipation, diarrhea, nausea and vomiting.  Endocrine: Negative for cold intolerance, heat intolerance, polydipsia and polyuria.  Musculoskeletal: Negative for arthralgias, back pain, joint swelling and neck pain.  Skin: Negative for rash.  Allergic/Immunologic: Negative for environmental allergies.  Neurological: Negative for dizziness, tremors, numbness and headaches.  Hematological: Negative for adenopathy. Does not bruise/bleed easily.  Psychiatric/Behavioral: Positive for dysphoric mood. Negative for behavioral problems (Depression), sleep disturbance and suicidal ideas. The patient is nervous/anxious.    Today's Vitals   10/27/19 1149  BP: (!) 142/80  Pulse: 100  Resp: 16  Temp: 97.7 F (36.5 C)  SpO2: 98%  Weight: 263 lb 6.4 oz (119.5 kg)  Height: 5\' 4"  (1.626 m)   Body mass index is 45.21 kg/m.  Physical Exam Vitals and nursing note reviewed.  Constitutional:      General: She is not in acute distress.    Appearance: Normal appearance. She is well-developed. She is obese. She is not diaphoretic.  HENT:     Head: Normocephalic and atraumatic.     Mouth/Throat:     Pharynx: No oropharyngeal exudate.  Eyes:     Pupils: Pupils are equal, round, and reactive to light.  Neck:     Thyroid: No thyromegaly.     Vascular: No JVD.     Trachea: No tracheal deviation.   Cardiovascular:     Rate and Rhythm: Normal rate and regular rhythm.     Heart sounds: Normal heart sounds. No murmur heard.  No friction rub. No gallop.   Pulmonary:     Effort: Pulmonary effort is normal. No respiratory distress.     Breath sounds: Normal breath sounds. No wheezing or rales.  Chest:     Chest wall: No tenderness.  Abdominal:     Palpations: Abdomen is soft.  Musculoskeletal:        General: Normal range of motion.     Cervical back: Normal range of motion and neck supple.  Lymphadenopathy:     Cervical: No cervical adenopathy.  Skin:    General: Skin is warm and dry.  Neurological:     Mental Status: She is alert and oriented  to person, place, and time.     Cranial Nerves: No cranial nerve deficit.  Psychiatric:        Mood and Affect: Mood normal.        Behavior: Behavior normal.        Thought Content: Thought content normal.        Judgment: Judgment normal.    Assessment/Plan: 1. Primary osteoarthritis of both wrists Overall, doing well. Continue meloxicam as needed and as prescribed.   2. Major depressive disorder, recurrent episode, moderate (HCC) Stable. Continue fluoxetine 20mg  daily. Refills provided today.  - FLUoxetine (PROZAC) 20 MG capsule; Take 1 capsule (20 mg total) by mouth daily.  Dispense: 30 capsule; Refill: 5  General Counseling: Jazmeen verbalizes understanding of the findings of todays visit and agrees with plan of treatment. I have discussed any further diagnostic evaluation that may be needed or ordered today. We also reviewed her medications today. she has been encouraged to call the office with any questions or concerns that should arise related to todays visit.   This patient was seen by FNP Collaboration with Dr Vincent Gros as a part of collaborative care agreement  Meds ordered this encounter  Medications  . FLUoxetine (PROZAC) 20 MG capsule    Sig: Take 1 capsule (20 mg total) by mouth daily.     Dispense:  30 capsule    Refill:  5    Order Specific Question:   Supervising Provider    Answer:   Lyndon Code [[id number]]    Total time spent: 25 Minutes   Time spent includes review of chart, medications, test results, and follow up plan with the patient.      Dr Lyndon Code Internal medicine

## 2019-11-12 DIAGNOSIS — M19031 Primary osteoarthritis, right wrist: Secondary | ICD-10-CM | POA: Insufficient documentation

## 2019-11-12 DIAGNOSIS — M19032 Primary osteoarthritis, left wrist: Secondary | ICD-10-CM | POA: Insufficient documentation

## 2019-11-14 ENCOUNTER — Telehealth: Payer: Self-pay

## 2019-11-14 NOTE — Telephone Encounter (Signed)
Spoke to pharmacy. Pt picked up fluoxetine 20mg  capsules on 11-04-19, 30 day supply which was covered by her insurance. No prior auth needed.

## 2019-11-28 ENCOUNTER — Other Ambulatory Visit: Payer: Self-pay

## 2019-11-28 DIAGNOSIS — F331 Major depressive disorder, recurrent, moderate: Secondary | ICD-10-CM

## 2019-11-28 MED ORDER — FLUOXETINE HCL 20 MG PO CAPS: 20.0000 mg | ORAL_CAPSULE | Freq: Every day | ORAL | 1 refills | Status: DC

## 2019-11-30 ENCOUNTER — Other Ambulatory Visit: Payer: Self-pay

## 2019-11-30 DIAGNOSIS — F331 Major depressive disorder, recurrent, moderate: Secondary | ICD-10-CM

## 2019-11-30 MED ORDER — FLUOXETINE HCL 20 MG PO CAPS: 20.0000 mg | ORAL_CAPSULE | Freq: Every day | ORAL | 1 refills | Status: DC

## 2020-03-28 ENCOUNTER — Encounter: Payer: BC Managed Care – PPO | Admitting: Hospice and Palliative Medicine

## 2020-04-11 ENCOUNTER — Encounter: Payer: BC Managed Care – PPO | Admitting: Physician Assistant

## 2020-04-12 ENCOUNTER — Other Ambulatory Visit: Payer: Self-pay

## 2020-04-12 ENCOUNTER — Encounter: Payer: Self-pay | Admitting: Physician Assistant

## 2020-04-12 ENCOUNTER — Ambulatory Visit (INDEPENDENT_AMBULATORY_CARE_PROVIDER_SITE_OTHER): Payer: BC Managed Care – PPO | Admitting: Physician Assistant

## 2020-04-12 VITALS — BP 138/84 | HR 75 | Temp 98.1°F | Resp 16 | Ht 64.0 in | Wt 258.2 lb

## 2020-04-12 DIAGNOSIS — N926 Irregular menstruation, unspecified: Secondary | ICD-10-CM

## 2020-04-12 DIAGNOSIS — R5383 Other fatigue: Secondary | ICD-10-CM

## 2020-04-12 DIAGNOSIS — Z124 Encounter for screening for malignant neoplasm of cervix: Secondary | ICD-10-CM | POA: Diagnosis not present

## 2020-04-12 DIAGNOSIS — Z113 Encounter for screening for infections with a predominantly sexual mode of transmission: Secondary | ICD-10-CM

## 2020-04-12 DIAGNOSIS — F331 Major depressive disorder, recurrent, moderate: Secondary | ICD-10-CM | POA: Diagnosis not present

## 2020-04-12 DIAGNOSIS — R300 Dysuria: Secondary | ICD-10-CM

## 2020-04-12 DIAGNOSIS — Z0001 Encounter for general adult medical examination with abnormal findings: Secondary | ICD-10-CM

## 2020-04-12 DIAGNOSIS — F411 Generalized anxiety disorder: Secondary | ICD-10-CM

## 2020-04-12 DIAGNOSIS — K219 Gastro-esophageal reflux disease without esophagitis: Secondary | ICD-10-CM

## 2020-04-12 MED ORDER — FLUOXETINE HCL 20 MG PO CAPS: 20.0000 mg | ORAL_CAPSULE | Freq: Every day | ORAL | 1 refills | Status: DC

## 2020-04-12 NOTE — Progress Notes (Signed)
[facility] [address] [place], [place] [address]  Internal MEDICINE  Office Visit Note  Patient Name: Rebecca Simmons  [id number]  [id number]  Date of Service: 04/13/2020  Chief Complaint  Patient presents with  . Annual Exam    With PAP smear  . Quality Metric Gaps    HIV screen, flu vacc     HPI Pt is here for routine health maintenance examination. She has no complaints. She sees OBGYN for irregular menstrual bleeding.  She reports that they did a biopsy and transvaginal US. She was told that they found a blockage in one of her fallopian tubes and thought it was blood clot. They were not concerned with this finding. They are encouraging her to lose weight to try to get pregnant. They started her on phentermine, but she was not taking it consistently due to her schedule while obtaining her Master's degree. She has just graduated and hope that her stress level will improve and her schedule will be better. She is committed to weight loss and will take phentermine as prescribed. She states her husband is taking testosterone shots now to help with fertility after he suffered a brain injury that disrupted his hormone levels. . Hernxiety and depression is stable on fluoxetine.  Current Medication: Outpatient Encounter Medications as of 04/12/2020  Medication Sig  . albuterol (PROAIR HFA) 108 (90 Base) MCG/ACT inhaler Inhale 1 to 2 puff by po 4 times daily as needed for wheezing/cough  . Magnesium 500 MG TABS Take by mouth daily.  . Multiple Vitamins-Minerals (MULTIVITAMIN ADULT) CHEW Chew by mouth daily.  Marland Kitchen OMEPRAZOLE PO Take 20 mg by mouth daily.  . [DISCONTINUED] FLUoxetine (PROZAC) 20 MG capsule Take 1 capsule (20 mg total) by mouth daily.  Marland Kitchen FLUoxetine (PROZAC) 20 MG capsule Take 1 capsule (20 mg total) by mouth daily.  . norethindrone (AYGESTIN) 5 MG tablet Take 5 mg by mouth daily.  . phentermine (ADIPEX-P) 37.5 MG tablet Take 37.5 mg by mouth daily.  .  [DISCONTINUED] meloxicam (MOBIC) 15 MG tablet Take 1 tablet (15 mg total) by mouth daily. (Patient not taking: Reported on 04/12/2020)  . [DISCONTINUED] metFORMIN (GLUCOPHAGE-XR) 750 MG 24 hr tablet Take 750 mg by mouth daily. (Patient not taking: Reported on 04/12/2020)   No facility-administered encounter medications on file as of 04/12/2020.    Surgical History: Past Surgical History:  Procedure Laterality Date  . FACIAL RECONSTRUCTION SURGERY     @30  years of age  . TONSILLECTOMY      Medical History: Past Medical History:  Diagnosis Date  . Anxiety   . Asthma   . Depression     Family History: Family History  Problem Relation Age of Onset  . Rheum arthritis Mother   . Cancer Maternal Uncle   . Seizures Maternal Uncle   . Breast cancer Maternal Grandmother   . Diabetes Maternal Grandmother   . Stroke Maternal Grandmother   . Cancer Paternal Grandfather       Review of Systems  Constitutional: Negative for chills, diaphoresis and fatigue.  HENT: Negative for ear pain, postnasal drip and sinus pressure.   Eyes: Negative for photophobia, discharge, redness, itching and visual disturbance.  Respiratory: Negative for cough, shortness of breath and wheezing.   Cardiovascular: Negative for chest pain, palpitations and leg swelling.  Gastrointestinal: Negative for abdominal pain, constipation, diarrhea, nausea and vomiting.  Genitourinary: Positive for menstrual problem. Negative for dysuria and flank pain.  Musculoskeletal: Negative for arthralgias, back pain, gait  problem and neck pain.  Skin: Negative for color change.  Allergic/Immunologic: Negative for environmental allergies and food allergies.  Neurological: Negative for dizziness and headaches.  Hematological: Does not bruise/bleed easily.  Psychiatric/Behavioral: Negative for agitation, behavioral problems (depression) and hallucinations. The patient is nervous/anxious.      Vital Signs: BP 138/84   Pulse 75    Temp 98.1 F (36.7 C)   Resp 16   Ht 5\' 4"  (1.626 m)   Wt 258 lb 3.2 oz (117.1 kg)   SpO2 99%   BMI 44.32 kg/m    Physical Exam Constitutional:      General: She is not in acute distress.    Appearance: She is well-developed. She is obese. She is not diaphoretic.  HENT:     Head: Normocephalic and atraumatic.     Right Ear: External ear normal.     Left Ear: External ear normal.     Nose: Nose normal.     Mouth/Throat:     Pharynx: No oropharyngeal exudate.  Eyes:     General: No scleral icterus.       Right eye: No discharge.        Left eye: No discharge.     Conjunctiva/sclera: Conjunctivae normal.     Pupils: Pupils are equal, round, and reactive to light.  Neck:     Thyroid: No thyromegaly.     Vascular: No JVD.     Trachea: No tracheal deviation.  Cardiovascular:     Rate and Rhythm: Normal rate and regular rhythm.     Heart sounds: Normal heart sounds. No murmur heard. No friction rub. No gallop.   Pulmonary:     Effort: Pulmonary effort is normal. No respiratory distress.     Breath sounds: Normal breath sounds. No stridor. No wheezing or rales.  Chest:     Chest wall: No tenderness.  Breasts:     Right: Normal. No mass.     Left: Normal. No mass.    Abdominal:     General: Bowel sounds are normal. There is no distension.     Palpations: Abdomen is soft. There is no mass.     Tenderness: There is no abdominal tenderness. There is no guarding or rebound.  Genitourinary:    General: Normal vulva.     Exam position: Lithotomy position.     Pubic Area: No rash.      Labia:        Right: No rash.        Left: No rash.      Vagina: Normal.     Cervix: Normal.  Musculoskeletal:        General: No tenderness or deformity. Normal range of motion.     Cervical back: Normal range of motion and neck supple.  Lymphadenopathy:     Cervical: No cervical adenopathy.  Skin:    General: Skin is warm and dry.     Coloration: Skin is not pale.     Findings: No  erythema or rash.  Neurological:     Mental Status: She is alert and oriented to person, place, and time.     Cranial Nerves: No cranial nerve deficit.     Motor: No abnormal muscle tone.     Coordination: Coordination normal.     Deep Tendon Reflexes: Reflexes are normal and symmetric.  Psychiatric:        Behavior: Behavior normal.        Thought Content: Thought content normal.  Judgment: Judgment normal.      LABS: Recent Results (from the past 2160 hour(s))  UA/M w/rflx Culture, Routine     Status: None   Collection Time: 04/12/20  9:29 AM   Specimen: Urine   Urine  Result Value Ref Range   Specific Gravity, UA 1.015 1.005 - 1.030   pH, UA 5.0 5.0 - 7.5   Color, UA Yellow Yellow   Appearance Ur Clear Clear   Leukocytes,UA Negative Negative   Protein,UA Negative Negative/Trace   Glucose, UA Negative Negative   Ketones, UA Negative Negative   RBC, UA Negative Negative   Bilirubin, UA Negative Negative   Urobilinogen, Ur 0.2 0.2 - 1.0 mg/dL   Nitrite, UA Negative Negative   Microscopic Examination Comment     Comment: Microscopic follows if indicated.   Microscopic Examination See below:     Comment: Microscopic was indicated and was performed.   Urinalysis Reflex Comment     Comment: This specimen will not reflex to a Urine Culture.  Microscopic Examination     Status: None   Collection Time: 04/12/20  9:29 AM   Urine  Result Value Ref Range   WBC, UA None seen 0 - 5 /hpf   RBC None seen 0 - 2 /hpf   Epithelial Cells (non renal) None seen 0 - 10 /hpf   Casts None seen None seen /lpf   Bacteria, UA None seen None seen/Few       Assessment/Plan: 1. Encounter for general adult medical examination with abnormal findings Ordered age appropriate labs to be done before next visit. - CBC with Differential/Platelet; Future - Lipid Panel With LDL/HDL Ratio; Future - TSH; Future - T4, free; Future - Comprehensive metabolic panel  2. Irregular menstrual  cycle Followed by OBGYN  3. Major depressive disorder, recurrent episode, moderate (HCC) Well controlled. Will continue Prozac. - FLUoxetine (PROZAC) 20 MG capsule; Take 1 capsule (20 mg total) by mouth daily.  Dispense: 90 capsule; Refill: 1  4. Generalized anxiety disorder Well controlled. Will continue Prozac.  5. Gastroesophageal reflux disease without esophagitis Continue Omeprazole  6. Morbid obesity (HCC) Started on phentermine by OBGYN. Will begin to take daily and work on diet and exercise.  7. Encounter for screening for cervical cancer - IGP, Aptima HPV  8. Screening for STDs (sexually transmitted diseases) - NuSwab Vaginitis Plus (VG+)  9. Dysuria - UA/M w/rflx Culture, Routine  10. Other fatigue - CBC with Differential/Platelet; Future - Lipid Panel With LDL/HDL Ratio; Future - TSH; Future - T4, free; Future - Comprehensive metabolic panel  General Counseling: Tayden verbalizes understanding of the findings of todays visit and agrees with plan of treatment. I have discussed any further diagnostic evaluation that may be needed or ordered today. We also reviewed her medications today. she has been encouraged to call the office with any questions or concerns that should arise related to todays visit.    Counseling:    Orders Placed This Encounter  Procedures  . Microscopic Examination  . UA/M w/rflx Culture, Routine  . NuSwab Vaginitis Plus (VG+)  . CBC with Differential/Platelet  . Lipid Panel With LDL/HDL Ratio  . TSH  . T4, free  . Comprehensive metabolic panel    Meds ordered this encounter  Medications  . FLUoxetine (PROZAC) 20 MG capsule    Sig: Take 1 capsule (20 mg total) by mouth daily.    Dispense:  90 capsule    Refill:  1    Total time spent:30  Minutes  Time spent includes review of chart, medications, test results, and follow up plan with the patient.     Lavera Guise, MD  Internal Medicine

## 2020-04-13 LAB — UA/M W/RFLX CULTURE, ROUTINE
Bilirubin, UA: NEGATIVE
Glucose, UA: NEGATIVE
Ketones, UA: NEGATIVE
Leukocytes,UA: NEGATIVE
Nitrite, UA: NEGATIVE
Protein,UA: NEGATIVE
RBC, UA: NEGATIVE
Specific Gravity, UA: 1.015 (ref 1.005–1.030)
Urobilinogen, Ur: 0.2 mg/dL (ref 0.2–1.0)
pH, UA: 5 (ref 5.0–7.5)

## 2020-04-13 LAB — MICROSCOPIC EXAMINATION
Bacteria, UA: NONE SEEN
Casts: NONE SEEN /lpf
Epithelial Cells (non renal): NONE SEEN /hpf (ref 0–10)
RBC, Urine: NONE SEEN /hpf (ref 0–2)
WBC, UA: NONE SEEN /hpf (ref 0–5)

## 2020-04-15 LAB — NUSWAB VAGINITIS PLUS (VG+)
Candida albicans, NAA: NEGATIVE
Candida glabrata, NAA: NEGATIVE
Chlamydia trachomatis, NAA: NEGATIVE
Neisseria gonorrhoeae, NAA: NEGATIVE
Trich vag by NAA: NEGATIVE

## 2020-04-17 LAB — IGP, APTIMA HPV: HPV Aptima: NEGATIVE

## 2020-04-26 ENCOUNTER — Ambulatory Visit: Payer: BC Managed Care – PPO | Admitting: Hospice and Palliative Medicine

## 2020-08-05 ENCOUNTER — Encounter: Payer: Self-pay | Admitting: Physician Assistant

## 2020-08-05 ENCOUNTER — Ambulatory Visit (INDEPENDENT_AMBULATORY_CARE_PROVIDER_SITE_OTHER): Payer: BC Managed Care – PPO | Admitting: Physician Assistant

## 2020-08-05 ENCOUNTER — Other Ambulatory Visit: Payer: Self-pay

## 2020-08-05 DIAGNOSIS — R5383 Other fatigue: Secondary | ICD-10-CM

## 2020-08-05 DIAGNOSIS — F331 Major depressive disorder, recurrent, moderate: Secondary | ICD-10-CM | POA: Diagnosis not present

## 2020-08-05 DIAGNOSIS — Z3189 Encounter for other procreative management: Secondary | ICD-10-CM

## 2020-08-05 DIAGNOSIS — Z713 Dietary counseling and surveillance: Secondary | ICD-10-CM | POA: Diagnosis not present

## 2020-08-05 DIAGNOSIS — Z6841 Body Mass Index (BMI) 40.0 and over, adult: Secondary | ICD-10-CM

## 2020-08-05 DIAGNOSIS — N921 Excessive and frequent menstruation with irregular cycle: Secondary | ICD-10-CM

## 2020-08-05 MED ORDER — BUPROPION HCL ER (XL) 150 MG PO TB24: 150.0000 mg | ORAL_TABLET | Freq: Every day | ORAL | 1 refills | Status: DC

## 2020-08-05 NOTE — Progress Notes (Signed)
[facility] [address] [place], [place] [address]  Internal MEDICINE  Office Visit Note  Patient Name: Rebecca Simmons  [id number]  [id number]  Date of Service: 08/07/2020  Chief Complaint  Patient presents with   Follow-up    Possible sinus infection, question about new gyn   Asthma   Depression   Anxiety    HPI Pt is here for routine follow up. 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 Has not found out results yet. -Left sided congestion last week with a little sore throat and some ear pressure. Feels better today though. No drainage coming out. Voice disappeared, but resolved. No cough. Took some daytime cold medicine and drops to soothe throat but otherwise no other meds. Tried claritin initially too.  -Had vaginal bleeding spell in April where she filled a pad in an hour. So went to urgent care and they sent her to ED. No OBGYN there and person present was resistant to exam and was given pain pills and diapers and d/c. Continued bleeding a few days. Last month had 1 more day of heavy bleeding requiring a diaper again and then continued lighter bleeding. Was unable to follow up with previous OBGYN bc she was changing insurance and could not pay upfront and left without being able to be seen. -Husband on hormones after brain surgery and doesn't want to continue paying for those if she is unable to get pregnant. Is not taking norethindrone currently to see if cycles normalize on their own. -No longer taking phentermine  Current Medication: Outpatient Encounter Medications as of 08/05/2020  Medication Sig   albuterol (PROAIR HFA) 108 (90 Base) MCG/ACT inhaler Inhale 1 to 2 puff by po 4 times daily as needed for wheezing/cough   buPROPion (WELLBUTRIN XL) 150 MG 24 hr tablet Take 1 tablet (150 mg total) by mouth daily.   FLUoxetine (PROZAC) 20 MG capsule Take 1 capsule (20 mg total) by mouth daily.   Magnesium 500 MG TABS Take by  mouth daily.   Multiple Vitamins-Minerals (MULTIVITAMIN ADULT) CHEW Chew by mouth daily.   norethindrone (AYGESTIN) 5 MG tablet Take 5 mg by mouth daily.   OMEPRAZOLE PO Take 20 mg by mouth daily.   phentermine (ADIPEX-P) 37.5 MG tablet Take 37.5 mg by mouth daily.   No facility-administered encounter medications on file as of 08/05/2020.    Surgical History: Past Surgical History:  Procedure Laterality Date   FACIAL RECONSTRUCTION SURGERY     @30  years of age   TONSILLECTOMY      Medical History: Past Medical History:  Diagnosis Date   Anxiety    Asthma    Depression     Family History: Family History  Problem Relation Age of Onset   Rheum arthritis Mother    Cancer Maternal Uncle    Seizures Maternal Uncle    Breast cancer Maternal Grandmother    Diabetes Maternal Grandmother    Stroke Maternal Grandmother    Cancer Paternal Grandfather     Social History   Socioeconomic History   Marital status: Single    Spouse name: Not on file   Number of children: Not on file   Years of education: Not on file   Highest education level: Not on file  Occupational History   Not on file  Tobacco Use   Smoking status: Never Smoker   Smokeless tobacco: Never Used  Vaping Use   Vaping Use: Never used  Substance  and Sexual Activity   Alcohol use: Yes    Comment: socially   Drug use: No   Sexual activity: Yes    Birth control/protection: None  Other Topics Concern   Not on file  Social History Narrative   Not on file   Social Determinants of Health   Financial Resource Strain: Not on file  Food Insecurity: Not on file  Transportation Needs: Not on file  Physical Activity: Not on file  Stress: Not on file  Social Connections: Not on file  Intimate Partner Violence: Not on file      Review of Systems  Constitutional: Negative for chills, fatigue and unexpected weight change.  HENT: Positive for congestion and postnasal drip. Negative for rhinorrhea, sneezing and  sore throat.   Eyes: Negative for redness.  Respiratory: Negative for cough, chest tightness and shortness of breath.   Cardiovascular: Negative for chest pain and palpitations.  Gastrointestinal: Negative for abdominal pain, constipation, diarrhea, nausea and vomiting.  Genitourinary: Positive for menstrual problem. Negative for dysuria and frequency.       Fertility concerns  Musculoskeletal: Negative for arthralgias, back pain, joint swelling and neck pain.  Skin: Negative for rash.  Neurological: Negative.  Negative for tremors and numbness.  Hematological: Negative for adenopathy. Does not bruise/bleed easily.  Psychiatric/Behavioral: Positive for behavioral problems (Depression). Negative for sleep disturbance and suicidal ideas. The patient is nervous/anxious.     Vital Signs: BP 122/88   Pulse 78   Temp 98.4 F (36.9 C)   Resp 16   Ht 5\' 4"  (1.626 m)   Wt 259 lb 9.6 oz (117.8 kg)   SpO2 96%   BMI 44.56 kg/m    Physical Exam Vitals and nursing note reviewed.  Constitutional:      General: She is not in acute distress.    Appearance: She is well-developed. She is obese. She is not diaphoretic.  HENT:     Head: Normocephalic and atraumatic.     Mouth/Throat:     Pharynx: No oropharyngeal exudate.  Eyes:     Pupils: Pupils are equal, round, and reactive to light.  Neck:     Thyroid: No thyromegaly.     Vascular: No JVD.     Trachea: No tracheal deviation.  Cardiovascular:     Rate and Rhythm: Normal rate and regular rhythm.     Heart sounds: Normal heart sounds. No murmur heard. No friction rub. No gallop.   Pulmonary:     Effort: Pulmonary effort is normal. No respiratory distress.     Breath sounds: No wheezing or rales.  Chest:     Chest wall: No tenderness.  Abdominal:     General: Bowel sounds are normal.     Palpations: Abdomen is soft.  Musculoskeletal:        General: Normal range of motion.     Cervical back: Normal range of motion and neck supple.   Lymphadenopathy:     Cervical: No cervical adenopathy.  Skin:    General: Skin is warm and dry.  Neurological:     Mental Status: She is alert and oriented to person, place, and time.     Cranial Nerves: No cranial nerve deficit.  Psychiatric:        Behavior: Behavior normal.        Thought Content: Thought content normal.        Judgment: Judgment normal.        Assessment/Plan: 1. Major depressive disorder, recurrent episode, moderate (HCC)  Continue Prozac and will start on wellbutrin for added wt loss beneift  2. Weight loss counseling, encounter for Will start on wellbutrin to help with weight loss and depression.  - buPROPion (WELLBUTRIN XL) 150 MG 24 hr tablet; Take 1 tablet (150 mg total) by mouth daily.  Dispense: 30 tablet; Refill: 1  3. Morbid obesity with BMI of 40.0-44.9, adult (HCC) Will start wellbutrin for weight loss in addition to diet and exercise changes. Obesity Counseling: Had a lengthy discussion regarding patients BMI and weight issues. Patient was instructed on portion control as well as increased activity. Also discussed caloric restrictions with trying to maintain intake less than 2000 Kcal. Discussions were made in accordance with the 5As of weight management. Simple actions such as not eating late and if able to, taking a walk is suggested.  4. Excessive menstruation with irregular cycle Will refer to new OBGYN for further management of irregular, heavy cycles and fertility concerns - Ambulatory referral to Obstetrics / Gynecology  5. Encounter for fertility planning Will refer to new OBGYN for further management of irregular, heavy cycles and fertility concerns.  - Ambulatory referral to Obstetrics / Gynecology  6. Other fatigue Patient did not get labs done yet, will go now - CBC w/Diff/Platelet - Comprehensive metabolic panel - Lipid Panel With LDL/HDL Ratio - TSH + free T4 - FSH/LH - Insulin, Free and Total   General Counseling:  Eustolia verbalizes understanding of the findings of todays visit and agrees with plan of treatment. I have discussed any further diagnostic evaluation that may be needed or ordered today. We also reviewed her medications today. she has been encouraged to call the office with any questions or concerns that should arise related to todays visit. High level, critical thinking for complex medical problems    Orders Placed This Encounter  Procedures   CBC w/Diff/Platelet   Comprehensive metabolic panel   Lipid Panel With LDL/HDL Ratio   TSH + free T4   FSH/LH   Insulin, Free and Total   Ambulatory referral to Obstetrics / Gynecology    Meds ordered this encounter  Medications   buPROPion (WELLBUTRIN XL) 150 MG 24 hr tablet    Sig: Take 1 tablet (150 mg total) by mouth daily.    Dispense:  30 tablet    Refill:  1    This patient was seen by Lynn Ito, PA-C in collaboration with Dr. Beverely Risen as a part of collaborative care agreement.   Total time spent:40 Minutes Time spent includes review of chart, medications, test results, and follow up plan with the patient.      Dr Lyndon Code Internal medicine

## 2020-08-07 NOTE — Patient Instructions (Signed)

## 2020-09-09 ENCOUNTER — Encounter: Payer: Self-pay | Admitting: Physician Assistant

## 2020-09-09 ENCOUNTER — Ambulatory Visit: Payer: BC Managed Care – PPO | Admitting: Physician Assistant

## 2020-09-09 ENCOUNTER — Ambulatory Visit (INDEPENDENT_AMBULATORY_CARE_PROVIDER_SITE_OTHER): Payer: 59 | Admitting: Physician Assistant

## 2020-09-09 ENCOUNTER — Other Ambulatory Visit: Payer: Self-pay

## 2020-09-09 DIAGNOSIS — K219 Gastro-esophageal reflux disease without esophagitis: Secondary | ICD-10-CM

## 2020-09-09 DIAGNOSIS — F411 Generalized anxiety disorder: Secondary | ICD-10-CM | POA: Diagnosis not present

## 2020-09-09 DIAGNOSIS — Z6841 Body Mass Index (BMI) 40.0 and over, adult: Secondary | ICD-10-CM

## 2020-09-09 DIAGNOSIS — Z0001 Encounter for general adult medical examination with abnormal findings: Secondary | ICD-10-CM

## 2020-09-09 DIAGNOSIS — N926 Irregular menstruation, unspecified: Secondary | ICD-10-CM

## 2020-09-09 DIAGNOSIS — F331 Major depressive disorder, recurrent, moderate: Secondary | ICD-10-CM

## 2020-09-09 DIAGNOSIS — R300 Dysuria: Secondary | ICD-10-CM

## 2020-09-09 DIAGNOSIS — F332 Major depressive disorder, recurrent severe without psychotic features: Secondary | ICD-10-CM | POA: Diagnosis not present

## 2020-09-09 DIAGNOSIS — Z113 Encounter for screening for infections with a predominantly sexual mode of transmission: Secondary | ICD-10-CM

## 2020-09-09 DIAGNOSIS — Z124 Encounter for screening for malignant neoplasm of cervix: Secondary | ICD-10-CM

## 2020-09-09 DIAGNOSIS — R5383 Other fatigue: Secondary | ICD-10-CM

## 2020-09-09 MED ORDER — FLUOXETINE HCL 40 MG PO CAPS: 40.0000 mg | ORAL_CAPSULE | Freq: Every day | ORAL | 3 refills | Status: DC

## 2020-09-09 NOTE — Progress Notes (Signed)
[facility] [address] [place], [place] [address]  Internal MEDICINE  Office Visit Note  Patient Name: Rebecca Simmons  [id number]  [id number]  Date of Service: 09/11/2020  Chief Complaint  Patient presents with   Depression   Weight Loss    Medication f-up   Results    Pt never got labs    HPI  Pt is here today for routine follow up. -She did not have her labs drawn and has lost 2 lbs since last visit -She reports feeling very depressed recently. She has been for a long time, but has been worse the past few months. She states she has come to the realization that her husband is not the same man she married since he has recovered from being sick/having surgery. States she was his biggest supporter in getting him better, but that he has changed and she fears telling him due to concern for how he will react/his feelings and emotional wellbeing. She admits to a history of suicide attempts via pills in the past, but states she has always been interrupted or found prior to them taking effect. She denies any current plan to harm herself or SI/HI. States she would not be talking about this in office if she were planning anything.  -Discussed ED vs [facility] walk-in crisis center options if she were to start to have SI/HI or any plan formations and pt understood and was given info for [facility] location. Also discussed urgent referral to psych to establish care and pursue counseling to which pt agreed. -Will increase Prozac to 40mg  and continue wellbutrin 150mg . -Patient called mother in office on speaker phone in my presence to let her know how she was feeling/make her aware of the situation and to make sure that no pills were accessible just in case she began to have any plan. Denies any access to firearms. -Pt instructed to call office before Friday to check in on how she is doing. Informed her that if we did not hear from her we would be calling on Friday and if not reached then rescue  services would be called to ensure her well-being. Patient understood and promised to check in.  Current Medication: Outpatient Encounter Medications as of 09/09/2020  Medication Sig   albuterol (PROAIR HFA) 108 (90 Base) MCG/ACT inhaler Inhale 1 to 2 puff by po 4 times daily as needed for wheezing/cough   buPROPion (WELLBUTRIN XL) 150 MG 24 hr tablet Take 1 tablet (150 mg total) by mouth daily.   FLUoxetine (PROZAC) 40 MG capsule Take 1 capsule (40 mg total) by mouth daily.   Magnesium 500 MG TABS Take by mouth daily.   Multiple Vitamins-Minerals (MULTIVITAMIN ADULT) CHEW Chew by mouth daily.   OMEPRAZOLE PO Take 20 mg by mouth daily.   [DISCONTINUED] FLUoxetine (PROZAC) 20 MG capsule Take 1 capsule (20 mg total) by mouth daily.   [DISCONTINUED] norethindrone (AYGESTIN) 5 MG tablet Take 5 mg by mouth daily. (Patient not taking: Reported on 09/09/2020)   [DISCONTINUED] phentermine (ADIPEX-P) 37.5 MG tablet Take 37.5 mg by mouth daily. (Patient not taking: Reported on 09/09/2020)   No facility-administered encounter medications on file as of 09/09/2020.    Surgical History: Past Surgical History:  Procedure Laterality Date   FACIAL RECONSTRUCTION SURGERY     @30  years of age   TONSILLECTOMY      Medical History: Past Medical History:  Diagnosis Date   Anxiety    Asthma    Depression  Family History: Family History  Problem Relation Age of Onset   Rheum arthritis Mother    Cancer Maternal Uncle    Seizures Maternal Uncle    Breast cancer Maternal Grandmother    Diabetes Maternal Grandmother    Stroke Maternal Grandmother    Cancer Paternal Grandfather     Social History   Socioeconomic History   Marital status: Single    Spouse name: Not on file   Number of children: Not on file   Years of education: Not on file   Highest education level: Not on file  Occupational History   Not on file  Tobacco Use   Smoking status: Never   Smokeless tobacco: Never  Vaping Use    Vaping Use: Never used  Substance and Sexual Activity   Alcohol use: Yes    Comment: socially   Drug use: No   Sexual activity: Yes    Birth control/protection: None  Other Topics Concern   Not on file  Social History Narrative   Not on file   Social Determinants of Health   Financial Resource Strain: Not on file  Food Insecurity: Not on file  Transportation Needs: Not on file  Physical Activity: Not on file  Stress: Not on file  Social Connections: Not on file  Intimate Partner Violence: Not on file      Review of Systems  Constitutional:  Positive for fatigue. Negative for chills and unexpected weight change.  HENT:  Negative for congestion, postnasal drip, rhinorrhea, sneezing and sore throat.   Eyes:  Negative for redness.  Respiratory:  Negative for cough, chest tightness and shortness of breath.   Cardiovascular:  Negative for chest pain and palpitations.  Gastrointestinal:  Negative for abdominal pain, constipation, diarrhea, nausea and vomiting.  Genitourinary:  Negative for dysuria and frequency.  Musculoskeletal:  Negative for arthralgias, back pain, joint swelling and neck pain.  Skin:  Negative for rash.  Neurological: Negative.  Negative for tremors and numbness.  Hematological:  Negative for adenopathy. Does not bruise/bleed easily.  Psychiatric/Behavioral:  Positive for behavioral problems (Depression). Negative for sleep disturbance and suicidal ideas. The patient is not nervous/anxious.    Vital Signs: BP 118/90   Pulse (!) 120   Temp 98.1 F (36.7 C)   Resp 16   Ht 5' 4.75" (1.645 m)   Wt 257 lb 12.8 oz (116.9 kg)   SpO2 98%   BMI 43.23 kg/m    Physical Exam Vitals and nursing note reviewed.  Constitutional:      General: She is not in acute distress.    Appearance: She is well-developed. She is obese. She is not diaphoretic.  HENT:     Head: Normocephalic and atraumatic.     Mouth/Throat:     Pharynx: No oropharyngeal exudate.  Eyes:      Pupils: Pupils are equal, round, and reactive to light.  Neck:     Thyroid: No thyromegaly.     Vascular: No JVD.     Trachea: No tracheal deviation.  Cardiovascular:     Rate and Rhythm: Normal rate and regular rhythm.     Heart sounds: Normal heart sounds. No murmur heard.   No friction rub. No gallop.  Pulmonary:     Effort: Pulmonary effort is normal. No respiratory distress.     Breath sounds: No wheezing or rales.  Chest:     Chest wall: No tenderness.  Abdominal:     General: Bowel sounds are normal.  Palpations: Abdomen is soft.  Musculoskeletal:        General: Normal range of motion.     Cervical back: Normal range of motion and neck supple.  Lymphadenopathy:     Cervical: No cervical adenopathy.  Skin:    General: Skin is warm and dry.  Neurological:     Mental Status: She is alert and oriented to person, place, and time.     Cranial Nerves: No cranial nerve deficit.  Psychiatric:        Thought Content: Thought content normal.     Comments: Pt depressed in office and tearful       Assessment/Plan: 1. Severe episode of recurrent major depressive disorder, without psychotic features (HCC) Increase prozac to 40mg  and continue wellbutrin. Urgent psych referral placed and info for [facility] walk-in center given today. Educated to go to ED or [facility] walk-in if any worsening or thoughts of harming herself. Pt also instructed to contact office by Friday to check-in and that if we have not heard from her and cannot reach her by phone then rescue services will be called. - Ambulatory referral to Psychiatry - FLUoxetine (PROZAC) 40 MG capsule; Take 1 capsule (40 mg total) by mouth daily.  Dispense: 90 capsule; Refill: 3  2. Generalized anxiety disorder Increase prozac to 40mg . - Ambulatory referral to Psychiatry - FLUoxetine (PROZAC) 40 MG capsule; Take 1 capsule (40 mg total) by mouth daily.  Dispense: 90 capsule; Refill: 3  3. Morbid obesity with BMI of 40.0-44.9,  adult (HCC) Continue wellbutrin for depression and wt loss. Advised to incorporate exercise to help with mood and wt loss as well. Also encouraged to have labs done when able.   General Counseling: Marvette verbalizes understanding of the findings of todays visit and agrees with plan of treatment. I have discussed any further diagnostic evaluation that may be needed or ordered today. We also reviewed her medications today. she has been encouraged to call the office with any questions or concerns that should arise related to todays visit.    Orders Placed This Encounter  Procedures   Ambulatory referral to Psychiatry    Meds ordered this encounter  Medications   FLUoxetine (PROZAC) 40 MG capsule    Sig: Take 1 capsule (40 mg total) by mouth daily.    Dispense:  90 capsule    Refill:  3    This patient was seen by , PA-C in collaboration with Dr. [birth year] as a part of collaborative care agreement.   Total time spent:40 Minutes Time spent includes review of chart, medications, test results, and follow up plan with the patient.      Dr Lynn Ito Internal medicine

## 2020-09-10 ENCOUNTER — Telehealth: Payer: Self-pay

## 2020-09-10 NOTE — Telephone Encounter (Signed)
Faxed referral to Dr.Kapur's office. Toni Amend

## 2020-09-10 NOTE — Telephone Encounter (Signed)
Dr. Shelda Altes office does not accept patient's insurance. I contacted patient this afternoon and advised her to contact her insurance company and see who all her insurance is in network with and to give me a call back. Did advise patient that [facility] does a "walk in" basis if she feels that she cannot wait until referral is placed to an accepting office. Rebecca Simmons

## 2020-09-13 ENCOUNTER — Telehealth: Payer: Self-pay

## 2020-09-13 NOTE — Telephone Encounter (Signed)
No plans to kill herself, no access to anything to kill herself with, mom has her pills, insurance has changed, working on getting new insurance card, stable enough for wanting to die, still working on getting in with someone

## 2020-09-13 NOTE — Telephone Encounter (Signed)
-----   Message from Carlean Jews, PA-C sent at 09/11/2020 10:08 AM EDT ----- Patient was instructed to contact office before Friday to check in on her mental state and make sure she is ok. Please call patient Friday to check on her if we have not heard from her. She was told we would escalate and consider contacting rescue services if we are not able to reach her and ensure her safety and wellbeing.

## 2020-09-24 ENCOUNTER — Telehealth: Payer: Self-pay

## 2020-09-30 ENCOUNTER — Other Ambulatory Visit: Payer: Self-pay

## 2020-09-30 ENCOUNTER — Ambulatory Visit (INDEPENDENT_AMBULATORY_CARE_PROVIDER_SITE_OTHER): Payer: 59 | Admitting: Certified Nurse Midwife

## 2020-09-30 ENCOUNTER — Encounter: Payer: Self-pay | Admitting: Certified Nurse Midwife

## 2020-09-30 VITALS — BP 130/85 | HR 96 | Ht 64.75 in | Wt 260.2 lb

## 2020-09-30 DIAGNOSIS — Z01419 Encounter for gynecological examination (general) (routine) without abnormal findings: Secondary | ICD-10-CM | POA: Diagnosis not present
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
## 2020-10-03 ENCOUNTER — Telehealth: Payer: Self-pay

## 2020-10-03 NOTE — Telephone Encounter (Signed)
Spoke with pt, meds helping, had first therapy session, pt says she will keep up with things

## 2020-10-03 NOTE — Telephone Encounter (Signed)
Pt is doing better and will keep up with meds and therapy

## 2020-10-18 ENCOUNTER — Telehealth: Payer: Self-pay

## 2020-10-18 NOTE — Telephone Encounter (Signed)
Left vm to confirm 10/21/20 appointment-Toni 

## 2020-10-21 ENCOUNTER — Other Ambulatory Visit: Payer: Self-pay

## 2020-10-21 ENCOUNTER — Encounter: Payer: Self-pay | Admitting: Physician Assistant

## 2020-10-21 ENCOUNTER — Ambulatory Visit: Payer: 59 | Admitting: Physician Assistant

## 2020-10-21 DIAGNOSIS — F331 Major depressive disorder, recurrent, moderate: Secondary | ICD-10-CM

## 2020-10-21 DIAGNOSIS — K219 Gastro-esophageal reflux disease without esophagitis: Secondary | ICD-10-CM | POA: Diagnosis not present

## 2020-10-21 DIAGNOSIS — F411 Generalized anxiety disorder: Secondary | ICD-10-CM | POA: Diagnosis not present

## 2020-10-21 DIAGNOSIS — Z6841 Body Mass Index (BMI) 40.0 and over, adult: Secondary | ICD-10-CM

## 2020-10-21 MED ORDER — OMEPRAZOLE 40 MG PO CPDR: 40.0000 mg | DELAYED_RELEASE_CAPSULE | Freq: Every day | ORAL | 3 refills | Status: DC

## 2020-10-21 MED ORDER — BUPROPION HCL ER (XL) 300 MG PO TB24: 300.0000 mg | ORAL_TABLET | Freq: Every day | ORAL | 2 refills | Status: DC

## 2020-10-21 NOTE — Progress Notes (Signed)
[facility] [address] [place], [place] [address]  Internal MEDICINE  Office Visit Note  Patient Name: Rebecca Simmons  [id number]  [id number]  Date of Service: 10/21/2020  Chief Complaint  Patient presents with   Follow-up   Anxiety    HPI Pt is here for routine follow up -She reports she is doing better and feels better since increasing prozac to 40mg . She has also established with counselor and has visits weekly on Thursdays which has helped. -She is still struggling with her weight and plans to join the gym this week to begin exercising. Will also try increasing Wellbutrin to 300mg  to see if this aids in wt loss while also helping mood.  Current Medication: Outpatient Encounter Medications as of 10/21/2020  Medication Sig   albuterol (PROAIR HFA) 108 (90 Base) MCG/ACT inhaler Inhale 1 to 2 puff by po 4 times daily as needed for wheezing/cough   buPROPion (WELLBUTRIN XL) 300 MG 24 hr tablet Take 1 tablet (300 mg total) by mouth daily.   FLUoxetine (PROZAC) 40 MG capsule Take 1 capsule (40 mg total) by mouth daily.   Magnesium 500 MG TABS Take by mouth daily.   Multiple Vitamins-Minerals (MULTIVITAMIN ADULT) CHEW Chew by mouth daily.   omeprazole (PRILOSEC) 40 MG capsule Take 1 capsule (40 mg total) by mouth daily.   [DISCONTINUED] buPROPion (WELLBUTRIN XL) 150 MG 24 hr tablet Take 1 tablet (150 mg total) by mouth daily.   [DISCONTINUED] OMEPRAZOLE PO Take 20 mg by mouth daily.   No facility-administered encounter medications on file as of 10/21/2020.    Surgical History: Past Surgical History:  Procedure Laterality Date   FACIAL RECONSTRUCTION SURGERY     @30  years of age   TONSILLECTOMY      Medical History: Past Medical History:  Diagnosis Date   Anxiety    Asthma    Depression     Family History: Family History  Problem Relation Age of Onset   Rheum arthritis Mother    Cancer Maternal Uncle    Seizures Maternal Uncle    Breast cancer  Maternal Grandmother    Diabetes Maternal Grandmother    Stroke Maternal Grandmother    Cancer Paternal Grandfather     Social History   Socioeconomic History   Marital status: Single    Spouse name: Not on file   Number of children: Not on file   Years of education: Not on file   Highest education level: Not on file  Occupational History   Not on file  Tobacco Use   Smoking status: Never   Smokeless tobacco: Never  Vaping Use   Vaping Use: Never used  Substance and Sexual Activity   Alcohol use: Yes    Comment: socially   Drug use: No   Sexual activity: Yes    Birth control/protection: None  Other Topics Concern   Not on file  Social History Narrative   Not on file   Social Determinants of Health   Financial Resource Strain: Not on file  Food Insecurity: Not on file  Transportation Needs: Not on file  Physical Activity: Not on file  Stress: Not on file  Social Connections: Not on file  Intimate Partner Violence: Not on file      Review of Systems  Constitutional:  Negative for chills, fatigue and unexpected weight change.  HENT:  Negative for congestion, postnasal drip, rhinorrhea, sneezing and sore throat.   Eyes:  Negative for redness.  Respiratory:  Negative for  cough, chest tightness and shortness of breath.   Cardiovascular:  Negative for chest pain and palpitations.  Gastrointestinal:  Negative for abdominal pain, constipation, diarrhea, nausea and vomiting.  Genitourinary:  Negative for dysuria and frequency.  Musculoskeletal:  Negative for arthralgias, back pain, joint swelling and neck pain.  Skin:  Negative for rash.  Neurological: Negative.  Negative for tremors and numbness.  Hematological:  Negative for adenopathy. Does not bruise/bleed easily.  Psychiatric/Behavioral:  Positive for behavioral problems (Depression). Negative for sleep disturbance and suicidal ideas. The patient is nervous/anxious.    Vital Signs: BP 124/78   Pulse 82   Temp  97.8 F (36.6 C)   Resp 16   Ht 5' 4.75" (1.645 m)   Wt 263 lb (119.3 kg)   SpO2 98%   BMI 44.10 kg/m    Physical Exam Vitals and nursing note reviewed.  Constitutional:      General: She is not in acute distress.    Appearance: She is well-developed. She is obese. She is not diaphoretic.  HENT:     Head: Normocephalic and atraumatic.     Mouth/Throat:     Pharynx: No oropharyngeal exudate.  Eyes:     Pupils: Pupils are equal, round, and reactive to light.  Neck:     Thyroid: No thyromegaly.     Vascular: No JVD.     Trachea: No tracheal deviation.  Cardiovascular:     Rate and Rhythm: Normal rate and regular rhythm.     Heart sounds: Normal heart sounds. No murmur heard.   No friction rub. No gallop.  Pulmonary:     Effort: Pulmonary effort is normal. No respiratory distress.     Breath sounds: No wheezing or rales.  Chest:     Chest wall: No tenderness.  Abdominal:     General: Bowel sounds are normal.     Palpations: Abdomen is soft.  Musculoskeletal:        General: Normal range of motion.     Cervical back: Normal range of motion and neck supple.  Lymphadenopathy:     Cervical: No cervical adenopathy.  Skin:    General: Skin is warm and dry.  Neurological:     Mental Status: She is alert and oriented to person, place, and time.     Cranial Nerves: No cranial nerve deficit.  Psychiatric:        Behavior: Behavior normal.        Thought Content: Thought content normal.        Judgment: Judgment normal.       Assessment/Plan: 1. Major depressive disorder, recurrent episode, moderate (HCC) Continue prozac and increase wellbutrin to 300mg  to help with mood and wt loss - buPROPion (WELLBUTRIN XL) 300 MG 24 hr tablet; Take 1 tablet (300 mg total) by mouth daily.  Dispense: 30 tablet; Refill: 2  2. Generalized anxiety disorder Continue prozac 40mg   3. Gastroesophageal reflux disease without esophagitis Requesting refill - omeprazole (PRILOSEC) 40 MG  capsule; Take 1 capsule (40 mg total) by mouth daily.  Dispense: 30 capsule; Refill: 3  4. Morbid obesity with BMI of 40.0-44.9, adult (HCC) Will try increasing wellbutrin, may need to consider alt or additional med to help with wt loss if not helping. Pt will also join gym this week and increase exercise - buPROPion (WELLBUTRIN XL) 300 MG 24 hr tablet; Take 1 tablet (300 mg total) by mouth daily.  Dispense: 30 tablet; Refill: 2 Obesity Counseling: Had a lengthy discussion regarding patients  BMI and weight issues. Patient was instructed on portion control as well as increased activity. Also discussed caloric restrictions with trying to maintain intake less than 2000 Kcal. Discussions were made in accordance with the 5As of weight management. Simple actions such as not eating late and if able to, taking a walk is suggested.   General Counseling: Rebecca Simmons verbalizes understanding of the findings of todays visit and agrees with plan of treatment. I have discussed any further diagnostic evaluation that may be needed or ordered today. We also reviewed her medications today. she has been encouraged to call the office with any questions or concerns that should arise related to todays visit.    No orders of the defined types were placed in this encounter.   Meds ordered this encounter  Medications   omeprazole (PRILOSEC) 40 MG capsule    Sig: Take 1 capsule (40 mg total) by mouth daily.    Dispense:  30 capsule    Refill:  3   buPROPion (WELLBUTRIN XL) 300 MG 24 hr tablet    Sig: Take 1 tablet (300 mg total) by mouth daily.    Dispense:  30 tablet    Refill:  2    This patient was seen by Lynn Ito, PA-C in collaboration with Dr. Beverely Risen as a part of collaborative care agreement.   Total time spent:30 Minutes Time spent includes review of chart, medications, test results, and follow up plan with the patient.      Dr Lyndon Code Internal medicine

## 2020-10-29 LAB — COMPREHENSIVE METABOLIC PANEL
ALT: 18 IU/L (ref 0–32)
AST: 15 IU/L (ref 0–40)
Albumin/Globulin Ratio: 2 (ref 1.2–2.2)
Albumin: 4.3 g/dL (ref 3.9–5.0)
Alkaline Phosphatase: 97 IU/L (ref 44–121)
BUN/Creatinine Ratio: 13 (ref 9–23)
BUN: 10 mg/dL (ref 6–20)
Bilirubin Total: 0.2 mg/dL (ref 0.0–1.2)
CO2: 23 mmol/L (ref 20–29)
Calcium: 9 mg/dL (ref 8.7–10.2)
Chloride: 103 mmol/L (ref 96–106)
Creatinine, Ser: 0.8 mg/dL (ref 0.57–1.00)
Globulin, Total: 2.2 g/dL (ref 1.5–4.5)
Glucose: 92 mg/dL (ref 65–99)
Potassium: 4.1 mmol/L (ref 3.5–5.2)
Sodium: 142 mmol/L (ref 134–144)
Total Protein: 6.5 g/dL (ref 6.0–8.5)
eGFR: 103 mL/min/{1.73_m2} (ref >59)

## 2020-10-29 LAB — CBC WITH DIFFERENTIAL/PLATELET
Basophils Absolute: 0.1 10*3/uL (ref 0.0–0.2)
Basos: 1 %
EOS (ABSOLUTE): 0.2 10*3/uL (ref 0.0–0.4)
Eos: 2 %
Hematocrit: 37.3 % (ref 34.0–46.6)
Hemoglobin: 11.7 g/dL (ref 11.1–15.9)
Immature Grans (Abs): 0 10*3/uL (ref 0.0–0.1)
Immature Granulocytes: 0 %
Lymphocytes Absolute: 2.4 10*3/uL (ref 0.7–3.1)
Lymphs: 33 %
MCH: 23.4 pg — ABNORMAL LOW (ref 26.6–33.0)
MCHC: 31.4 g/dL — ABNORMAL LOW (ref 31.5–35.7)
MCV: 75 fL — ABNORMAL LOW (ref 79–97)
Monocytes Absolute: 0.5 10*3/uL (ref 0.1–0.9)
Monocytes: 7 %
Neutrophils Absolute: 4.3 10*3/uL (ref 1.4–7.0)
Neutrophils: 57 %
Platelets: 289 10*3/uL (ref 150–450)
RBC: 5 x10E6/uL (ref 3.77–5.28)
RDW: 15.2 % (ref 11.7–15.4)
WBC: 7.5 10*3/uL (ref 3.4–10.8)

## 2020-10-29 LAB — LIPID PANEL WITH LDL/HDL RATIO
Cholesterol, Total: 161 mg/dL (ref 100–199)
HDL: 47 mg/dL (ref >39)
LDL Chol Calc (NIH): 98 mg/dL (ref 0–99)
LDL/HDL Ratio: 2.1 ratio (ref 0.0–3.2)
Triglycerides: 87 mg/dL (ref 0–149)
VLDL Cholesterol Cal: 16 mg/dL (ref 5–40)

## 2020-10-29 LAB — TSH+FREE T4
Free T4: 1.14 ng/dL (ref 0.82–1.77)
TSH: 4.85 u[IU]/mL — ABNORMAL HIGH (ref 0.450–4.500)

## 2020-10-29 LAB — FSH/LH
FSH: 6 m[IU]/mL
LH: 6.7 m[IU]/mL

## 2020-10-29 LAB — INSULIN, FREE AND TOTAL
Free Insulin: 45 uU/mL — ABNORMAL HIGH
Total Insulin: 45 uU/mL

## 2020-12-09 ENCOUNTER — Other Ambulatory Visit: Payer: Self-pay

## 2020-12-09 MED ORDER — ALBUTEROL SULFATE HFA 108 (90 BASE) MCG/ACT IN AERS: INHALATION_SPRAY | RESPIRATORY_TRACT | 3 refills | Status: DC

## 2020-12-13 ENCOUNTER — Other Ambulatory Visit: Payer: Self-pay

## 2020-12-13 ENCOUNTER — Ambulatory Visit: Payer: 59 | Admitting: Physician Assistant

## 2020-12-13 ENCOUNTER — Encounter: Payer: Self-pay | Admitting: Physician Assistant

## 2020-12-13 VITALS — BP 129/82 | HR 97 | Temp 98.5°F | Resp 16 | Ht 64.75 in | Wt 263.0 lb

## 2020-12-13 DIAGNOSIS — J069 Acute upper respiratory infection, unspecified: Secondary | ICD-10-CM

## 2020-12-13 MED ORDER — AZITHROMYCIN 250 MG PO TABS
ORAL_TABLET | ORAL | 0 refills | Status: DC
Start: 2020-12-13 — End: 2021-01-20

## 2020-12-13 NOTE — Progress Notes (Signed)
[facility] [address] [place], [place] [address]  Internal MEDICINE  Office Visit Note  Patient Name: Rebecca Simmons  [id number]  [id number]  Date of Service: 12/13/2020  Chief Complaint  Patient presents with   bronchitis flare     HPI Pt is here for a sick visit. -Started having sinus congestion and bad cough last week. She tried delsym and mucinex which helped some but cough has progressed. Covid test was negative on Tues. Was able to go into work with a mask once covid test was negative. -Wheezing and a little SOB with coughing fits and will use inhaler which helps. She has a history of bronchitis, which is why she had an inhaler already, but has not had this problem in many years. -Sleeping elevated to help due to drainage  Current Medication:  Outpatient Encounter Medications as of 12/13/2020  Medication Sig   albuterol (PROAIR HFA) 108 (90 Base) MCG/ACT inhaler Inhale 1 to 2 puff by po 4 times daily as needed for wheezing/cough   azithromycin (ZITHROMAX) 250 MG tablet Take one tab a day for 10 days for uri   buPROPion (WELLBUTRIN XL) 300 MG 24 hr tablet Take 1 tablet (300 mg total) by mouth daily.   Ergocalciferol (VITAMIN D2) 50 MCG (2000 UT) TABS Take by mouth.   FLUoxetine (PROZAC) 40 MG capsule Take 1 capsule (40 mg total) by mouth daily.   Folic Acid (FOLATE PO) Take by mouth.   Magnesium 500 MG TABS Take by mouth daily.   Multiple Vitamins-Minerals (MULTIVITAMIN ADULT) CHEW Chew by mouth daily.   norethindrone (AYGESTIN) 5 MG tablet Take 5 mg by mouth daily.   omeprazole (PRILOSEC) 40 MG capsule Take 1 capsule (40 mg total) by mouth daily.   No facility-administered encounter medications on file as of 12/13/2020.      Medical History: Past Medical History:  Diagnosis Date   Anxiety    Asthma    Depression      Vital Signs: BP 129/82   Pulse 97   Temp 98.5 F (36.9 C)   Resp 16   Ht 5' 4.75" (1.645 m)   Wt 263 lb (119.3 kg)    SpO2 96%   BMI 44.10 kg/m    Review of Systems  Constitutional:  Negative for fatigue and fever.  HENT:  Positive for congestion, postnasal drip and sinus pressure. Negative for mouth sores.   Respiratory:  Positive for cough, shortness of breath and wheezing.   Cardiovascular:  Negative for chest pain.  Genitourinary:  Negative for flank pain.  Psychiatric/Behavioral: Negative.     Physical Exam Vitals and nursing note reviewed.  Constitutional:      General: She is not in acute distress.    Appearance: She is well-developed. She is obese. She is not diaphoretic.  HENT:     Head: Normocephalic and atraumatic.     Mouth/Throat:     Pharynx: No oropharyngeal exudate.  Eyes:     Pupils: Pupils are equal, round, and reactive to light.  Neck:     Thyroid: No thyromegaly.     Vascular: No JVD.     Trachea: No tracheal deviation.  Cardiovascular:     Rate and Rhythm: Normal rate and regular rhythm.     Heart sounds: Normal heart sounds. No murmur heard.   No friction rub. No gallop.  Pulmonary:     Effort: Pulmonary effort is normal. No respiratory distress.     Breath sounds: No wheezing or rales.  Chest:     Chest wall: No tenderness.  Abdominal:     General: Bowel sounds are normal.     Palpations: Abdomen is soft.  Musculoskeletal:        General: Normal range of motion.     Cervical back: Normal range of motion and neck supple.  Lymphadenopathy:     Cervical: No cervical adenopathy.  Skin:    General: Skin is warm and dry.  Neurological:     Mental Status: She is alert and oriented to person, place, and time.     Cranial Nerves: No cranial nerve deficit.  Psychiatric:        Behavior: Behavior normal.        Thought Content: Thought content normal.        Judgment: Judgment normal.      Assessment/Plan: 1. Upper respiratory tract infection, unspecified type Will start on zpak and continue inhaler as needed. Pt also advised to continue mucinex and delsym and  may add flonse as well. Pt will call if not improving with ABX use and may need to consider CXR then. - azithromycin (ZITHROMAX) 250 MG tablet; Take one tab a day for 10 days for uri  Dispense: 10 tablet; Refill: 0   General Counseling: [name] verbalizes understanding of the findings of todays visit and agrees with plan of treatment. I have discussed any further diagnostic evaluation that may be needed or ordered today. We also reviewed her medications today. she has been encouraged to call the office with any questions or concerns that should arise related to todays visit.    Counseling:    No orders of the defined types were placed in this encounter.   Meds ordered this encounter  Medications   azithromycin (ZITHROMAX) 250 MG tablet    Sig: Take one tab a day for 10 days for uri    Dispense:  10 tablet    Refill:  0     Time spent:25 Minutes

## 2020-12-26 ENCOUNTER — Ambulatory Visit: Payer: 59 | Admitting: Physician Assistant

## 2021-01-20 ENCOUNTER — Other Ambulatory Visit: Payer: Self-pay

## 2021-01-20 ENCOUNTER — Encounter: Payer: Self-pay | Admitting: Physician Assistant

## 2021-01-20 ENCOUNTER — Ambulatory Visit: Payer: 59 | Admitting: Physician Assistant

## 2021-01-20 VITALS — BP 125/78 | HR 96 | Temp 98.0°F | Resp 16 | Ht 64.3 in | Wt 265.0 lb

## 2021-01-20 DIAGNOSIS — Z6841 Body Mass Index (BMI) 40.0 and over, adult: Secondary | ICD-10-CM

## 2021-01-20 DIAGNOSIS — R7989 Other specified abnormal findings of blood chemistry: Secondary | ICD-10-CM | POA: Diagnosis not present

## 2021-01-20 DIAGNOSIS — F331 Major depressive disorder, recurrent, moderate: Secondary | ICD-10-CM

## 2021-01-20 DIAGNOSIS — F411 Generalized anxiety disorder: Secondary | ICD-10-CM

## 2021-01-20 MED ORDER — TOPIRAMATE 25 MG PO TABS: 25.0000 mg | ORAL_TABLET | Freq: Every day | ORAL | 2 refills | Status: DC

## 2021-01-20 NOTE — Progress Notes (Signed)
[facility] [address] [place], [place] [address]  Internal MEDICINE  Office Visit Note  Patient Name: Rebecca Simmons  [id number]  [id number]  Date of Service: 01/26/2021  Chief Complaint  Patient presents with   Follow-up   Medical Management of Chronic Issues    Weight management    HPI Pt is here for routine follow up for weight management -She does report one hypoglycemic episode, hadnt eaten much and laid in bed and couldn't see well--vision blurry. Her mom checked BG and said it was low. She is staying on top of it now having at least OJ in AM and has had no recurrence. -New OBGYN, stated they told her everything was fine based on prior OBGYN's workup -She has not noticed a weight change with increased wellbutrin, will add topiramate -Mood stable and continues to take 40mg  prozac -reviewed labs showing low MCV which is likely due to iron deficiency and pt states this is chronic for her and will supplement iron. Her Tsh was also elevated but free t4 normal and will need to keep a close eye on this.  Current Medication: Outpatient Encounter Medications as of 01/20/2021  Medication Sig   albuterol (PROAIR HFA) 108 (90 Base) MCG/ACT inhaler Inhale 1 to 2 puff by po 4 times daily as needed for wheezing/cough   buPROPion (WELLBUTRIN XL) 300 MG 24 hr tablet Take 1 tablet (300 mg total) by mouth daily.   Ergocalciferol (VITAMIN D2) 50 MCG (2000 UT) TABS Take by mouth.   FLUoxetine (PROZAC) 40 MG capsule Take 1 capsule (40 mg total) by mouth daily.   Folic Acid (FOLATE PO) Take by mouth.   Magnesium 500 MG TABS Take by mouth daily.   Multiple Vitamins-Minerals (MULTIVITAMIN ADULT) CHEW Chew by mouth daily.   norethindrone (AYGESTIN) 5 MG tablet Take 5 mg by mouth daily.   omeprazole (PRILOSEC) 40 MG capsule Take 1 capsule (40 mg total) by mouth daily.   topiramate (TOPAMAX) 25 MG tablet Take 1 tablet (25 mg total) by mouth daily.   [DISCONTINUED] azithromycin  (ZITHROMAX) 250 MG tablet Take one tab a day for 10 days for uri   No facility-administered encounter medications on file as of 01/20/2021.    Surgical History: Past Surgical History:  Procedure Laterality Date   FACIAL RECONSTRUCTION SURGERY     @30  years of age   TONSILLECTOMY      Medical History: Past Medical History:  Diagnosis Date   Anxiety    Asthma    Depression     Family History: Family History  Problem Relation Age of Onset   Rheum arthritis Mother    Cancer Maternal Uncle    Seizures Maternal Uncle    Breast cancer Maternal Grandmother    Diabetes Maternal Grandmother    Stroke Maternal Grandmother    Cancer Paternal Grandfather     Social History   Socioeconomic History   Marital status: Single    Spouse name: Not on file   Number of children: Not on file   Years of education: Not on file   Highest education level: Not on file  Occupational History   Not on file  Tobacco Use   Smoking status: Never   Smokeless tobacco: Never  Vaping Use   Vaping Use: Never used  Substance and Sexual Activity   Alcohol use: Yes    Comment: socially   Drug use: No   Sexual activity: Yes    Birth control/protection: None  Other Topics Concern  Not on file  Social History Narrative   Not on file   Social Determinants of Health   Financial Resource Strain: Not on file  Food Insecurity: Not on file  Transportation Needs: Not on file  Physical Activity: Not on file  Stress: Not on file  Social Connections: Not on file  Intimate Partner Violence: Not on file      Review of Systems  Constitutional:  Negative for chills, fatigue and unexpected weight change.  HENT:  Negative for congestion, postnasal drip, rhinorrhea, sneezing and sore throat.   Eyes:  Negative for redness.  Respiratory:  Negative for cough, chest tightness and shortness of breath.   Cardiovascular:  Negative for chest pain and palpitations.  Gastrointestinal:  Negative for abdominal  pain, constipation, diarrhea, nausea and vomiting.  Genitourinary:  Negative for dysuria and frequency.  Musculoskeletal:  Negative for arthralgias, back pain, joint swelling and neck pain.  Skin:  Negative for rash.  Neurological: Negative.  Negative for tremors and numbness.  Hematological:  Negative for adenopathy. Does not bruise/bleed easily.  Psychiatric/Behavioral:  Positive for behavioral problems (Depression). Negative for sleep disturbance and suicidal ideas. The patient is nervous/anxious.    Vital Signs: BP 125/78   Pulse 96   Temp 98 F (36.7 C)   Resp 16   Ht 5' 4.3" (1.633 m)   Wt 265 lb (120.2 kg)   SpO2 96%   BMI 45.06 kg/m    Physical Exam Vitals and nursing note reviewed.  Constitutional:      General: She is not in acute distress.    Appearance: She is well-developed. She is obese. She is not diaphoretic.  HENT:     Head: Normocephalic and atraumatic.     Mouth/Throat:     Pharynx: No oropharyngeal exudate.  Eyes:     Pupils: Pupils are equal, round, and reactive to light.  Neck:     Thyroid: No thyromegaly.     Vascular: No JVD.     Trachea: No tracheal deviation.  Cardiovascular:     Rate and Rhythm: Normal rate and regular rhythm.     Heart sounds: Normal heart sounds. No murmur heard.   No friction rub. No gallop.  Pulmonary:     Effort: Pulmonary effort is normal. No respiratory distress.     Breath sounds: No wheezing or rales.  Chest:     Chest wall: No tenderness.  Abdominal:     General: Bowel sounds are normal.     Palpations: Abdomen is soft.  Musculoskeletal:        General: Normal range of motion.     Cervical back: Normal range of motion and neck supple.  Lymphadenopathy:     Cervical: No cervical adenopathy.  Skin:    General: Skin is warm and dry.  Neurological:     Mental Status: She is alert and oriented to person, place, and time.     Cranial Nerves: No cranial nerve deficit.  Psychiatric:        Behavior: Behavior  normal.        Thought Content: Thought content normal.        Judgment: Judgment normal.       Assessment/Plan: 1. Major depressive disorder, recurrent episode, moderate (HCC) Will continue prozac and therapy as well as wellbutrin  2. Generalized anxiety disorder Will continue prozac and therapy  3. Elevated TSH Will continue to monitor, may need [place]  4. Morbid obesity with BMI of 45.0-49.9, adult (HCC) Will continue  wellbutrin and add topiramate Obesity Counseling: Had a lengthy discussion regarding patients BMI and weight issues. Patient was instructed on portion control as well as increased activity. Also discussed caloric restrictions with trying to maintain intake less than 2000 Kcal. Discussions were made in accordance with the 5As of weight management. Simple actions such as not eating late and if able to, taking a walk is suggested.    General Counseling: Denasia verbalizes understanding of the findings of todays visit and agrees with plan of treatment. I have discussed any further diagnostic evaluation that may be needed or ordered today. We also reviewed her medications today. she has been encouraged to call the office with any questions or concerns that should arise related to todays visit.    No orders of the defined types were placed in this encounter.   Meds ordered this encounter  Medications   topiramate (TOPAMAX) 25 MG tablet    Sig: Take 1 tablet (25 mg total) by mouth daily.    Dispense:  30 tablet    Refill:  2    This patient was seen by Lynn Ito, PA-C in collaboration with Dr. Beverely Risen as a part of collaborative care agreement.   Total time spent:30 Minutes Time spent includes review of chart, medications, test results, and follow up plan with the patient.      Dr Lyndon Code Internal medicine

## 2021-02-24 ENCOUNTER — Other Ambulatory Visit: Payer: Self-pay | Admitting: Physician Assistant

## 2021-02-24 DIAGNOSIS — F331 Major depressive disorder, recurrent, moderate: Secondary | ICD-10-CM

## 2021-02-24 DIAGNOSIS — Z6841 Body Mass Index (BMI) 40.0 and over, adult: Secondary | ICD-10-CM

## 2021-03-21 ENCOUNTER — Other Ambulatory Visit: Payer: Self-pay

## 2021-03-21 ENCOUNTER — Encounter: Payer: Self-pay | Admitting: Physician Assistant

## 2021-03-21 ENCOUNTER — Ambulatory Visit: Payer: 59 | Admitting: Physician Assistant

## 2021-03-21 ENCOUNTER — Telehealth: Payer: Self-pay

## 2021-03-21 DIAGNOSIS — Z6841 Body Mass Index (BMI) 40.0 and over, adult: Secondary | ICD-10-CM

## 2021-03-21 DIAGNOSIS — F331 Major depressive disorder, recurrent, moderate: Secondary | ICD-10-CM | POA: Diagnosis not present

## 2021-03-21 DIAGNOSIS — F411 Generalized anxiety disorder: Secondary | ICD-10-CM | POA: Diagnosis not present

## 2021-03-21 DIAGNOSIS — G4710 Hypersomnia, unspecified: Secondary | ICD-10-CM | POA: Diagnosis not present

## 2021-03-21 DIAGNOSIS — G5603 Carpal tunnel syndrome, bilateral upper limbs: Secondary | ICD-10-CM | POA: Diagnosis not present

## 2021-03-21 DIAGNOSIS — K219 Gastro-esophageal reflux disease without esophagitis: Secondary | ICD-10-CM

## 2021-03-21 MED ORDER — OMEPRAZOLE 40 MG PO CPDR: 40.0000 mg | DELAYED_RELEASE_CAPSULE | Freq: Every day | ORAL | 3 refills | Status: DC

## 2021-03-21 MED ORDER — TOPIRAMATE 25 MG PO TABS: 25.0000 mg | ORAL_TABLET | Freq: Every day | ORAL | 2 refills | Status: DC

## 2021-03-21 NOTE — Telephone Encounter (Signed)
Sleep Study ordered. Printed. Gave to Titania-Toni ?

## 2021-03-21 NOTE — Progress Notes (Signed)
[facility] [place], [place] [address]  Internal MEDICINE  Office Visit Note  Patient Name: Rebecca Simmons  [id number]  [id number]  Date of Service: 03/21/2021  Chief Complaint  Patient presents with   Follow-up   Depression   Asthma    HPI Pt is here for routine follow up -Back being treated with tens and heating pads and stretching but is worried about bulging disc and making sure she doesn't aggravate it with home workouts which is limited what exercise she is doing. States she is going to follow up with ortho and may benefit from PT -numbness and carpal tunnel symptoms in both wrists, has one brace that helps and will get another so she can wear night splints. May also wear during certain aggravating activities. -Is down 1 pound from last visit despite the holidays and feels the topamax is helping and she is doing better with her portions. -depression has been ok, did have a few days where she was more down again but was near the holidays and thinks that could be why. She is overall doing ok on the prozac -denies any repeat dizziness or feeling that her sugars are dropping Sleep is ok, varies based on the night. She sometimes takes Olly gummies to help sleep, but never wakes refreshed even when she does get 8 hours of sleep. Has been told she snores, but denies gasping awake or witnessed apneas. EPWORTH SLEEPINESS SCALE:  Scale:  (0)= no chance of dozing; (1)= slight chance of dozing; (2)= moderate chance of dozing; (3)= high chance of dozing  Chance  Situtation    Sitting and reading: 0    Watching TV: 2    Sitting Inactive in public: 0    As a passenger in car: 1      Lying down to rest: 2    Sitting and talking: 1    Sitting quielty after lunch: 2    In a car, stopped in traffic: 0   TOTAL SCORE:   8 out of 24    Current Medication: Outpatient Encounter Medications as of 03/21/2021  Medication Sig   albuterol (PROAIR HFA) 108  (90 Base) MCG/ACT inhaler Inhale 1 to 2 puff by po 4 times daily as needed for wheezing/cough   buPROPion (WELLBUTRIN XL) 300 MG 24 hr tablet TAKE 1 TABLET(300 MG) BY MOUTH DAILY   Ergocalciferol (VITAMIN D2) 50 MCG (2000 UT) TABS Take by mouth.   FLUoxetine (PROZAC) 40 MG capsule Take 1 capsule (40 mg total) by mouth daily.   Folic Acid (FOLATE PO) Take by mouth.   Magnesium 500 MG TABS Take by mouth daily.   Multiple Vitamins-Minerals (MULTIVITAMIN ADULT) CHEW Chew by mouth daily.   norethindrone (AYGESTIN) 5 MG tablet Take 5 mg by mouth daily.   [DISCONTINUED] omeprazole (PRILOSEC) 40 MG capsule Take 1 capsule (40 mg total) by mouth daily.   [DISCONTINUED] topiramate (TOPAMAX) 25 MG tablet Take 1 tablet (25 mg total) by mouth daily.   omeprazole (PRILOSEC) 40 MG capsule Take 1 capsule (40 mg total) by mouth daily.   topiramate (TOPAMAX) 25 MG tablet Take 1 tablet (25 mg total) by mouth daily.   No facility-administered encounter medications on file as of 03/21/2021.    Surgical History: Past Surgical History:  Procedure Laterality Date   FACIAL RECONSTRUCTION SURGERY     @31  years of age   TONSILLECTOMY      Medical History: Past Medical History:  Diagnosis Date   Anxiety  Asthma    Depression     Family History: Family History  Problem Relation Age of Onset   Rheum arthritis Mother    Cancer Maternal Uncle    Seizures Maternal Uncle    Breast cancer Maternal Grandmother    Diabetes Maternal Grandmother    Stroke Maternal Grandmother    Cancer Paternal Grandfather     Social History   Socioeconomic History   Marital status: Single    Spouse name: Not on file   Number of children: Not on file   Years of education: Not on file   Highest education level: Not on file  Occupational History   Not on file  Tobacco Use   Smoking status: Never   Smokeless tobacco: Never  Vaping Use   Vaping Use: Never used  Substance and Sexual Activity   Alcohol use: Yes     Comment: socially   Drug use: No   Sexual activity: Yes    Birth control/protection: None  Other Topics Concern   Not on file  Social History Narrative   Not on file   Social Determinants of Health   Financial Resource Strain: Not on file  Food Insecurity: Not on file  Transportation Needs: Not on file  Physical Activity: Not on file  Stress: Not on file  Social Connections: Not on file  Intimate Partner Violence: Not on file      Review of Systems  Constitutional:  Negative for chills, fatigue and unexpected weight change.  HENT:  Negative for congestion, postnasal drip, rhinorrhea, sneezing and sore throat.   Eyes:  Negative for redness.  Respiratory:  Negative for cough, chest tightness and shortness of breath.   Cardiovascular:  Negative for chest pain and palpitations.  Gastrointestinal:  Negative for abdominal pain, constipation, diarrhea, nausea and vomiting.  Genitourinary:  Negative for dysuria and frequency.  Musculoskeletal:  Positive for back pain. Negative for arthralgias, joint swelling and neck pain.  Skin:  Negative for rash.  Neurological:  Positive for numbness. Negative for tremors.  Hematological:  Negative for adenopathy. Does not bruise/bleed easily.  Psychiatric/Behavioral:  Positive for behavioral problems (Depression) and sleep disturbance. Negative for suicidal ideas. The patient is nervous/anxious.    Vital Signs: BP 134/87    Pulse 87    Temp 98.4 F (36.9 C)    Resp 16    Ht 5' 4.3" (1.633 m)    Wt 264 lb (119.7 kg)    SpO2 97%    BMI 44.89 kg/m    Physical Exam Vitals and nursing note reviewed.  Constitutional:      General: She is not in acute distress.    Appearance: She is well-developed. She is obese. She is not diaphoretic.  HENT:     Head: Normocephalic and atraumatic.     Mouth/Throat:     Pharynx: No oropharyngeal exudate.  Eyes:     Pupils: Pupils are equal, round, and reactive to light.  Neck:     Thyroid: No thyromegaly.      Vascular: No JVD.     Trachea: No tracheal deviation.  Cardiovascular:     Rate and Rhythm: Normal rate and regular rhythm.     Heart sounds: Normal heart sounds. No murmur heard.   No friction rub. No gallop.  Pulmonary:     Effort: Pulmonary effort is normal. No respiratory distress.     Breath sounds: No wheezing or rales.  Chest:     Chest wall: No tenderness.  Abdominal:  General: Bowel sounds are normal.     Palpations: Abdomen is soft.  Musculoskeletal:        General: Normal range of motion.     Cervical back: Normal range of motion and neck supple.  Lymphadenopathy:     Cervical: No cervical adenopathy.  Skin:    General: Skin is warm and dry.  Neurological:     Mental Status: She is alert and oriented to person, place, and time.     Cranial Nerves: No cranial nerve deficit.  Psychiatric:        Behavior: Behavior normal.        Thought Content: Thought content normal.        Judgment: Judgment normal.       Assessment/Plan: 1. Major depressive disorder, recurrent episode, moderate (HCC) Stable, continue prozac and wellbutrin  2. Generalized anxiety disorder Stable, continue prozac and wellbutrin  3. Hypersomnia Based on snoring, fatigue, difficulty losing weight, and elevated BMI will order PSG to evaluate. - PSG SLEEP STUDY  4. Bilateral carpal tunnel syndrome Will wear night splints and may discuss with ortho if worsening when she goes to discuss her back  5. Gastroesophageal reflux disease without esophagitis - omeprazole (PRILOSEC) 40 MG capsule; Take 1 capsule (40 mg total) by mouth daily.  Dispense: 30 capsule; Refill: 3  6. Morbid obesity with BMI of 40.0-44.9, adult ([place]) Will continue wellbutrin and topamax and work on diet and exercise. Will order PSG to evaluate for OSA.   General Counseling: Arsema verbalizes understanding of the findings of todays visit and agrees with plan of treatment. I have discussed any further diagnostic  evaluation that may be needed or ordered today. We also reviewed her medications today. she has been encouraged to call the office with any questions or concerns that should arise related to todays visit.    Orders Placed This Encounter  Procedures   PSG SLEEP STUDY    Meds ordered this encounter  Medications   omeprazole (PRILOSEC) 40 MG capsule    Sig: Take 1 capsule (40 mg total) by mouth daily.    Dispense:  30 capsule    Refill:  3   topiramate (TOPAMAX) 25 MG tablet    Sig: Take 1 tablet (25 mg total) by mouth daily.    Dispense:  30 tablet    Refill:  2    This patient was seen by Drema Dallas, PA-C in collaboration with Dr. Clayborn Bigness as a part of collaborative care agreement.   Total time spent:30 Minutes Time spent includes review of chart, medications, test results, and follow up plan with the patient.      Dr Lavera Guise Internal medicine

## 2021-05-02 ENCOUNTER — Ambulatory Visit: Payer: 59 | Admitting: Physician Assistant

## 2021-05-02 DIAGNOSIS — Z0289 Encounter for other administrative examinations: Secondary | ICD-10-CM

## 2021-05-19 ENCOUNTER — Other Ambulatory Visit: Payer: Self-pay | Admitting: Physician Assistant

## 2021-05-19 DIAGNOSIS — F331 Major depressive disorder, recurrent, moderate: Secondary | ICD-10-CM

## 2021-05-22 ENCOUNTER — Telehealth: Payer: Self-pay

## 2021-05-22 NOTE — Telephone Encounter (Signed)
Patient scheduled for home sleep study on 05/28/21 @ [facility]tat ?

## 2021-06-25 ENCOUNTER — Other Ambulatory Visit: Payer: Self-pay | Admitting: Physician Assistant

## 2021-06-25 NOTE — Telephone Encounter (Signed)
Pt need appt for refills  ?

## 2021-07-12 ENCOUNTER — Ambulatory Visit (HOSPITAL_COMMUNITY)
Admission: EM | Admit: 2021-07-12 | Discharge: 2021-07-12 | Disposition: A | Discharge status: Home / Self Care | Payer: 59 | Attending: Physician Assistant | Admitting: Physician Assistant

## 2021-07-12 ENCOUNTER — Encounter (HOSPITAL_COMMUNITY): Payer: Self-pay | Admitting: Emergency Medicine

## 2021-07-12 DIAGNOSIS — N898 Other specified noninflammatory disorders of vagina: Secondary | ICD-10-CM | POA: Diagnosis not present

## 2021-07-12 DIAGNOSIS — N760 Acute vaginitis: Secondary | ICD-10-CM | POA: Diagnosis not present

## 2021-07-12 DIAGNOSIS — Z113 Encounter for screening for infections with a predominantly sexual mode of transmission: Secondary | ICD-10-CM

## 2021-07-12 LAB — POCT URINALYSIS DIPSTICK, ED / UC
Bilirubin Urine: NEGATIVE
Glucose, UA: NEGATIVE mg/dL
Hgb urine dipstick: NEGATIVE
Ketones, ur: NEGATIVE mg/dL
Leukocytes,Ua: NEGATIVE
Nitrite: NEGATIVE
Protein, ur: NEGATIVE mg/dL
Specific Gravity, Urine: 1.02 (ref 1.005–1.030)
Urobilinogen, UA: 0.2 mg/dL (ref 0.0–1.0)
pH: 7 (ref 5.0–8.0)

## 2021-07-12 LAB — HIV ANTIBODY (ROUTINE TESTING W REFLEX): HIV Screen 4th Generation wRfx: NONREACTIVE

## 2021-07-12 MED ORDER — METRONIDAZOLE 500 MG PO TABS: 500.0000 mg | ORAL_TABLET | Freq: Two times a day (BID) | ORAL | 0 refills | Status: DC

## 2021-07-12 NOTE — ED Triage Notes (Signed)
Pt reports noticing a yellow discharge today. States she had unprotected sex a few months ago and requests STD testing. Denies any other symptoms.  ?

## 2021-07-12 NOTE — ED Provider Notes (Signed)
?[facility] ? ? ? ?CSN: [id number] ?Arrival date & time: 07/12/21  1537 ? ? ?  ? ?History   ?Chief Complaint ?Chief Complaint  ?Patient presents with  ? Vaginal Discharge  ? ? ?HPI ?Rebecca Simmons is a 31 y.o. female.  ? ?31 year old female presents with vaginal discharge.  Patient relates for the past 1 to 2 days she has noticed that she is having vaginal discharge.  Patient relates that the discharge is yellow, no odor, no burning with urination.  Patient indicates she is concerned because she had unprotected sex several months ago, she is concerned about having a possible STI.  Patient indicates that her last menses was May 6, normal menses for her, she does relate having excessively heavy periods that last longer than 7 days.  Patient relates she has not noticed any sores on the genital area.  She is not having any fever or chills, no abdominal pain, no nausea. ? ? ?Vaginal Discharge ? ?Past Medical History:  ?Diagnosis Date  ? Anxiety   ? Asthma   ? Depression   ? ? ?Patient Active Problem List  ? Diagnosis Date Noted  ? Primary osteoarthritis of both wrists 11/12/2019  ? Acute carpal tunnel syndrome of right wrist 12/18/2018  ? Menorrhagia with irregular cycle 03/18/2018  ? Irregular menstrual cycle 03/18/2018  ? Encounter for general adult medical examination with abnormal findings 12/21/2017  ? Peptic ulcer disease 12/21/2017  ? Gastroesophageal reflux disease without esophagitis 12/21/2017  ? Abnormal weight gain 12/21/2017  ? Generalized anxiety disorder 11/26/2017  ? Other fatigue 10/15/2017  ? Major depressive disorder, recurrent episode, moderate (HCC) 04/23/2017  ? Vitamin D deficiency 04/23/2017  ? ? ?Past Surgical History:  ?Procedure Laterality Date  ? FACIAL RECONSTRUCTION SURGERY    ? @31  years of age  ? TONSILLECTOMY    ? ? ?OB History   ? ? Gravida  ?0  ? Para  ?0  ? Term  ?0  ? Preterm  ?0  ? AB  ?0  ? Living  ?0  ?  ? ? SAB  ?0  ? IAB  ?0  ? Ectopic  ?0  ? Multiple  ?0  ? Live  Births  ?0  ?   ?  ?  ? ? ? ?Home Medications   ? ?Prior to Admission medications   ?Medication Sig Start Date End Date Taking? Authorizing Provider  ?metroNIDAZOLE (FLAGYL) 500 MG tablet Take 1 tablet (500 mg total) by mouth 2 (two) times daily. 07/12/21  Yes Ellsworth LennoxJames, Reford Olliff, PA-C  ?albuterol [facility](PROAIR HFA) 108 (90 Base) MCG/ACT inhaler Inhale 1 to 2 puff by po 4 times daily as needed for wheezing/cough 12/09/20   Carlean JewsMcDonough, Lauren K, PA-C  ?buPROPion (WELLBUTRIN XL) 300 MG 24 hr tablet TAKE 1 TABLET(300 MG) BY MOUTH DAILY 05/20/21   McDonough, Salomon FickLauren K, PA-C  ?Ergocalciferol (VITAMIN D2) 50 MCG (2000 UT) TABS Take by mouth.    [provider]  ?FLUoxetine (PROZAC) 40 MG capsule Take 1 capsule (40 mg total) by mouth daily. 09/09/20   McDonough, Salomon FickLauren K, PA-C  ?Folic Acid (FOLATE PO) Take by mouth.    [provider]  ?Magnesium 500 MG TABS Take by mouth daily.    [provider]  ?Multiple Vitamins-Minerals (MULTIVITAMIN ADULT) CHEW Chew by mouth daily.    [provider]  ?norethindrone (AYGESTIN) 5 MG tablet Take 5 mg by mouth daily. 11/30/20   [provider]  ?omeprazole (PRILOSEC) 40 MG capsule  Take 1 capsule (40 mg total) by mouth daily. 03/21/21   McDonough, Salomon Fick, PA-C  ?topiramate (TOPAMAX) 25 MG tablet TAKE 1 TABLET(25 MG) BY MOUTH DAILY 06/25/21   McDonough, Salomon Fick, PA-C  ? ? ?Family History ?Family History  ?Problem Relation Age of Onset  ? Rheum arthritis Mother   ? Cancer Maternal Uncle   ? Seizures Maternal Uncle   ? Breast cancer Maternal Grandmother   ? Diabetes Maternal Grandmother   ? Stroke Maternal Grandmother   ? Cancer Paternal Grandfather   ? ? ?Social History ?Social History  ? ?Tobacco Use  ? Smoking status: Never  ? Smokeless tobacco: Never  ?Vaping Use  ? Vaping Use: Never used  ?Substance Use Topics  ? Alcohol use: Yes  ?  Comment: socially  ? Drug use: No  ? ? ? ?Allergies   ?Patient has no known allergies. ? ? ?Review of Systems ?Review of  Systems  ?Genitourinary:  Positive for vaginal discharge.  ? ? ?Physical Exam ?Triage Vital Signs ?ED Triage Vitals  ?Enc Vitals Group  ?   BP 07/12/21 1607 (!) 155/84  ?   Pulse Rate 07/12/21 1607 (!) 103  ?   Resp 07/12/21 1607 16  ?   Temp 07/12/21 1607 98.1 ?F (36.7 ?C)  ?   Temp Source 07/12/21 1607 Oral  ?   SpO2 07/12/21 1607 96 %  ?   Weight 07/12/21 1606 263 lb 14.3 oz (119.7 kg)  ?   Height 07/12/21 1606 5' 4.3" (1.633 m)  ?   Head Circumference --   ?   Peak Flow --   ?   Pain Score 07/12/21 1606 0  ?   Pain Loc --   ?   Pain Edu? --   ?   Excl. in GC? --   ? ?No data found. ? ?Updated Vital Signs ?BP (!) 155/84 (BP Location: Right Arm)   Pulse (!) 103   Temp 98.1 ?F (36.7 ?C) (Oral)   Resp 16   Ht 5' 4.3" (1.633 m)   Wt 263 lb 14.3 oz (119.7 kg)   SpO2 96%   BMI 44.88 kg/m?  ? ?Visual Acuity ?Right Eye Distance:   ?Left Eye Distance:   ?Bilateral Distance:   ? ?Right Eye Near:   ?Left Eye Near:    ?Bilateral Near:    ? ?Physical Exam ?Constitutional:   ?   Appearance: Normal appearance.  ?Cardiovascular:  ?   Rate and Rhythm: Normal rate and regular rhythm.  ?Pulmonary:  ?   Comments: Lungs: Normal breath sounds without rales, rhonchi, or wheezing bilaterally. ?Abdominal:  ?   Comments: Abdomen: Normal bowel sounds all quadrants, no masses, guarding, or rebound.  ?Genitourinary: ?   Comments: Genital: Pelvic exam performed with speculum, vaginal canal with yellow discharge present, external labial areas no lesions are present. ?Neurological:  ?   Mental Status: She is alert.  ? ? ? ?UC Treatments / Results  ?Labs ?(all labs ordered are listed, but only abnormal results are displayed) ?Labs Reviewed  ?RPR  ?HIV ANTIBODY (ROUTINE TESTING W REFLEX)  ?POCT URINALYSIS DIPSTICK, ED / UC  ?CERVICOVAGINAL ANCILLARY ONLY  ? ? ?EKG ? ? ?Radiology ?No results found. ? ?Procedures ?Procedures (including critical care time) ? ?Medications Ordered in UC ?Medications - No data to display ? ?Initial Impression /  Assessment and Plan / UC Course  ?I have reviewed the triage vital signs and the nursing notes. ? ?Pertinent labs &  imaging results that were available during my care of the patient were reviewed by me and considered in my medical decision making (see chart for details). ? ?  ?Plan: ?STI testing pending, RPR pending, HIV screen pending. ?Advised to take the Flagyl 500 mg 1 twice daily as directed. ?Patient advised to follow-up with PCP if symptoms fail to improve. ?Final Clinical Impressions(s) / UC Diagnoses  ? ?Final diagnoses:  ?Vaginitis and vulvovaginitis  ?Routine screening for STI (sexually transmitted infection)  ?Vaginal discharge  ? ? ? ?Discharge Instructions   ? ?  ?Advised to take medication as directed. ?Avoid any type of alcohol-containing products for the next 10 days. ?Follow-up with PCP if symptoms fail to improve. ? ? ?ED Prescriptions   ? ? Medication Sig Dispense Auth. Provider  ? metroNIDAZOLE (FLAGYL) 500 MG tablet Take 1 tablet (500 mg total) by mouth 2 (two) times daily. 14 tablet Ellsworth Lennox, PA-C  ? ?  ? ?PDMP not reviewed this encounter. ?  ?Ellsworth Lennox, PA-C ?07/12/21 1731 ? ?

## 2021-07-12 NOTE — Discharge Instructions (Signed)
Advised to take medication as directed. ?Avoid any type of alcohol-containing products for the next 10 days. ?Follow-up with PCP if symptoms fail to improve. ?

## 2021-07-13 LAB — RPR: RPR Ser Ql: NONREACTIVE

## 2021-07-14 LAB — CERVICOVAGINAL ANCILLARY ONLY
Bacterial Vaginitis (gardnerella): NEGATIVE
Candida Glabrata: NEGATIVE
Candida Vaginitis: POSITIVE — AB
Chlamydia: NEGATIVE
Comment: NEGATIVE
Comment: NEGATIVE
Comment: NEGATIVE
Comment: NEGATIVE
Comment: NEGATIVE
Comment: NORMAL
Neisseria Gonorrhea: NEGATIVE
Trichomonas: NEGATIVE

## 2021-07-15 ENCOUNTER — Telehealth (HOSPITAL_COMMUNITY): Payer: Self-pay | Admitting: Emergency Medicine

## 2021-07-15 MED ORDER — FLUCONAZOLE 150 MG PO TABS: 150.0000 mg | ORAL_TABLET | Freq: Once | ORAL | 0 refills | Status: AC

## 2021-10-06 ENCOUNTER — Encounter: Payer: Self-pay | Admitting: Certified Nurse Midwife

## 2021-10-18 ENCOUNTER — Other Ambulatory Visit: Payer: Self-pay | Admitting: Physician Assistant

## 2021-10-18 DIAGNOSIS — K219 Gastro-esophageal reflux disease without esophagitis: Secondary | ICD-10-CM

## 2021-12-04 ENCOUNTER — Other Ambulatory Visit: Payer: Self-pay | Admitting: Physician Assistant

## 2021-12-04 DIAGNOSIS — F411 Generalized anxiety disorder: Secondary | ICD-10-CM

## 2021-12-04 DIAGNOSIS — F332 Major depressive disorder, recurrent severe without psychotic features: Secondary | ICD-10-CM

## 2021-12-08 ENCOUNTER — Other Ambulatory Visit: Payer: Self-pay | Admitting: Physician Assistant

## 2021-12-08 DIAGNOSIS — K219 Gastro-esophageal reflux disease without esophagitis: Secondary | ICD-10-CM

## 2021-12-09 ENCOUNTER — Other Ambulatory Visit: Payer: Self-pay | Admitting: Physician Assistant

## 2021-12-09 DIAGNOSIS — K219 Gastro-esophageal reflux disease without esophagitis: Secondary | ICD-10-CM

## 2021-12-09 NOTE — Telephone Encounter (Signed)
Pt need appt for further refills.

## 2021-12-09 NOTE — Telephone Encounter (Signed)
Pt need appt for refills send 30 days

## 2021-12-09 NOTE — Telephone Encounter (Signed)
I am denying this RX, she needs CPE

## 2021-12-15 ENCOUNTER — Encounter: Payer: Self-pay | Admitting: Physician Assistant

## 2021-12-15 ENCOUNTER — Telehealth: Payer: Self-pay

## 2021-12-15 ENCOUNTER — Other Ambulatory Visit: Payer: Self-pay

## 2021-12-15 ENCOUNTER — Ambulatory Visit: Payer: BC Managed Care – PPO | Admitting: Physician Assistant

## 2021-12-15 VITALS — BP 134/83 | HR 92 | Temp 98.3°F | Resp 16 | Ht 64.3 in | Wt 256.2 lb

## 2021-12-15 DIAGNOSIS — N921 Excessive and frequent menstruation with irregular cycle: Secondary | ICD-10-CM

## 2021-12-15 DIAGNOSIS — F331 Major depressive disorder, recurrent, moderate: Secondary | ICD-10-CM

## 2021-12-15 DIAGNOSIS — E559 Vitamin D deficiency, unspecified: Secondary | ICD-10-CM

## 2021-12-15 DIAGNOSIS — R7989 Other specified abnormal findings of blood chemistry: Secondary | ICD-10-CM

## 2021-12-15 DIAGNOSIS — K219 Gastro-esophageal reflux disease without esophagitis: Secondary | ICD-10-CM | POA: Diagnosis not present

## 2021-12-15 DIAGNOSIS — E782 Mixed hyperlipidemia: Secondary | ICD-10-CM

## 2021-12-15 DIAGNOSIS — R5383 Other fatigue: Secondary | ICD-10-CM

## 2021-12-15 DIAGNOSIS — N926 Irregular menstruation, unspecified: Secondary | ICD-10-CM

## 2021-12-15 DIAGNOSIS — E538 Deficiency of other specified B group vitamins: Secondary | ICD-10-CM

## 2021-12-15 DIAGNOSIS — D500 Iron deficiency anemia secondary to blood loss (chronic): Secondary | ICD-10-CM

## 2021-12-15 MED ORDER — PANTOPRAZOLE SODIUM 40 MG PO TBEC: 40.0000 mg | DELAYED_RELEASE_TABLET | Freq: Every day | ORAL | 3 refills | Status: DC

## 2021-12-15 MED ORDER — OMEPRAZOLE 40 MG PO CPDR: DELAYED_RELEASE_CAPSULE | ORAL | 2 refills | Status: DC

## 2021-12-15 NOTE — Telephone Encounter (Signed)
Pt advised that omeprazole is not covered we send pantoprazole as per lauren

## 2021-12-15 NOTE — Progress Notes (Signed)
[facility] [address] [place], [place] [address]  Internal MEDICINE  Office Visit Note  Patient Name: Rebecca Simmons  [id number]  [id number]  Date of Service: 12/23/2021  Chief Complaint  Patient presents with   Follow-up    Follow up med refills   Depression    HPI Pt is here for routine follow up for med refills -Has been having period for 4 months, Daily flow for the past 2 months. Some blood clots. Fatigued. No cramping. -Has the name of new OBGYN she is going to call. -Needs caffeine to get through the day -Low appetite, eating 1-2 times per day with occasional snacks. -Drinking lots of water. -Did join gym, and has been able to do elliptical, and some weights. -Did not have sleep study. -Just got insurance back and is working on getting back on track -Will update labs, especially given abnormal cycle and risk of anemia which may be contributing to fatigue  Current Medication: Outpatient Encounter Medications as of 12/15/2021  Medication Sig   albuterol (PROAIR HFA) 108 (90 Base) MCG/ACT inhaler Inhale 1 to 2 puff by po 4 times daily as needed for wheezing/cough   Ergocalciferol (VITAMIN D2) 50 MCG (2000 UT) TABS Take by mouth.   FLUoxetine (PROZAC) 40 MG capsule TAKE 1 CAPSULE(40 MG) BY MOUTH DAILY   Folic Acid (FOLATE PO) Take by mouth.   Magnesium 500 MG TABS Take by mouth daily.   metroNIDAZOLE (FLAGYL) 500 MG tablet Take 1 tablet (500 mg total) by mouth 2 (two) times daily.   Multiple Vitamins-Minerals (MULTIVITAMIN ADULT) CHEW Chew by mouth daily.   norethindrone (AYGESTIN) 5 MG tablet Take 5 mg by mouth daily.   topiramate (TOPAMAX) 25 MG tablet TAKE 1 TABLET(25 MG) BY MOUTH DAILY   [DISCONTINUED] buPROPion (WELLBUTRIN XL) 300 MG 24 hr tablet TAKE 1 TABLET(300 MG) BY MOUTH DAILY   [DISCONTINUED] omeprazole (PRILOSEC) 40 MG capsule TAKE 1 CAPSULE(40 MG) BY MOUTH DAILY   [DISCONTINUED] omeprazole (PRILOSEC) 40 MG capsule TAKE 1 CAPSULE(40  MG) BY MOUTH DAILY   No facility-administered encounter medications on file as of 12/15/2021.    Surgical History: Past Surgical History:  Procedure Laterality Date   FACIAL RECONSTRUCTION SURGERY     @31  years of age   TONSILLECTOMY      Medical History: Past Medical History:  Diagnosis Date   Anxiety    Asthma    Depression    GERD (gastroesophageal reflux disease)     Family History: Family History  Problem Relation Age of Onset   Rheum arthritis Mother    Cancer Maternal Uncle    Seizures Maternal Uncle    Breast cancer Maternal Grandmother    Diabetes Maternal Grandmother    Stroke Maternal Grandmother    Cancer Paternal Grandfather     Social History   Socioeconomic History   Marital status: Single    Spouse name: Not on file   Number of children: Not on file   Years of education: Not on file   Highest education level: Not on file  Occupational History   Not on file  Tobacco Use   Smoking status: Never   Smokeless tobacco: Never  Vaping Use   Vaping Use: Never used  Substance and Sexual Activity   Alcohol use: Yes    Comment: socially   Drug use: No   Sexual activity: Yes    Birth control/protection: None  Other Topics Concern   Not on file  Social History Narrative  Not on file   Social Determinants of Health   Financial Resource Strain: Not on file  Food Insecurity: Not on file  Transportation Needs: Not on file  Physical Activity: Not on file  Stress: Not on file  Social Connections: Not on file  Intimate Partner Violence: Not on file      Review of Systems  Constitutional:  Negative for chills, fatigue and unexpected weight change.  HENT:  Negative for congestion, postnasal drip, rhinorrhea, sneezing and sore throat.   Eyes:  Negative for redness.  Respiratory:  Negative for cough, chest tightness and shortness of breath.   Cardiovascular:  Negative for chest pain and palpitations.  Gastrointestinal:  Negative for abdominal  pain, constipation, diarrhea, nausea and vomiting.  Genitourinary:  Negative for dysuria and frequency.  Musculoskeletal:  Negative for arthralgias, back pain, joint swelling and neck pain.  Skin:  Negative for rash.  Neurological: Negative.  Negative for tremors and numbness.  Hematological:  Negative for adenopathy. Does not bruise/bleed easily.  Psychiatric/Behavioral:  Positive for behavioral problems (Depression) and sleep disturbance. Negative for suicidal ideas. The patient is nervous/anxious.     Vital Signs: BP 134/83   Pulse 92   Temp 98.3 F (36.8 C)   Resp 16   Ht 5' 4.3" (1.633 m)   Wt 256 lb 3.2 oz (116.2 kg)   SpO2 98%   BMI 43.57 kg/m    Physical Exam Vitals and nursing note reviewed.  Constitutional:      General: She is not in acute distress.    Appearance: She is well-developed. She is obese. She is not diaphoretic.  HENT:     Head: Normocephalic and atraumatic.     Mouth/Throat:     Pharynx: No oropharyngeal exudate.  Eyes:     Pupils: Pupils are equal, round, and reactive to light.  Neck:     Thyroid: No thyromegaly.     Vascular: No JVD.     Trachea: No tracheal deviation.  Cardiovascular:     Rate and Rhythm: Normal rate and regular rhythm.     Heart sounds: Normal heart sounds. No murmur heard.    No friction rub. No gallop.  Pulmonary:     Effort: Pulmonary effort is normal. No respiratory distress.     Breath sounds: No wheezing or rales.  Chest:     Chest wall: No tenderness.  Abdominal:     General: Bowel sounds are normal.     Palpations: Abdomen is soft.  Musculoskeletal:        General: Normal range of motion.     Cervical back: Normal range of motion and neck supple.  Lymphadenopathy:     Cervical: No cervical adenopathy.  Skin:    General: Skin is warm and dry.  Neurological:     Mental Status: She is alert and oriented to person, place, and time.     Cranial Nerves: No cranial nerve deficit.  Psychiatric:        Behavior:  Behavior normal.        Thought Content: Thought content normal.        Judgment: Judgment normal.        Assessment/Plan: 1. Excessive menstruation with irregular cycle Patient will establish with new OBGYN, referral placed. Will check labs as she is at risk for anemia - CBC w/Diff/Platelet - Fe+TIBC+Fer - Hgb A1C w/o eAG - Ambulatory referral to Obstetrics / Gynecology  2. Iron deficiency anemia due to chronic blood loss Will need to  establish with OBGYN due to excessive menstruation leading to risk of anemia and will check labs and treat accordingly. - Ambulatory referral to Obstetrics / Gynecology  3. Gastroesophageal reflux disease without esophagitis Continue PPI  4. Major depressive disorder, recurrent episode, moderate (HCC) Continue prozac  5. Elevated TSH - TSH + free T4  6. Mixed hyperlipidemia - Lipid Panel With LDL/HDL Ratio  7. B12 deficiency - B12 and Folate Panel  8. Vitamin D deficiency - VITAMIN D 25 Hydroxy (Vit-D Deficiency, Fractures)  9. Other fatigue - CBC w/Diff/Platelet - Comprehensive metabolic panel - TSH + free T4 - B12 and Folate Panel - Fe+TIBC+Fer - Lipid Panel With LDL/HDL Ratio - VITAMIN D 25 Hydroxy (Vit-D Deficiency, Fractures) - Hgb A1C w/o eAG   General Counseling: Dore verbalizes understanding of the findings of todays visit and agrees with plan of treatment. I have discussed any further diagnostic evaluation that may be needed or ordered today. We also reviewed her medications today. she has been encouraged to call the office with any questions or concerns that should arise related to todays visit.    Orders Placed This Encounter  Procedures   CBC w/Diff/Platelet   Comprehensive metabolic panel   TSH + free T4   B12 and Folate Panel   Fe+TIBC+Fer   Lipid Panel With LDL/HDL Ratio   VITAMIN D 25 Hydroxy (Vit-D Deficiency, Fractures)   Hgb A1C w/o eAG   Ambulatory referral to Obstetrics / Gynecology    Meds  ordered this encounter  Medications   DISCONTD: omeprazole (PRILOSEC) 40 MG capsule    Sig: TAKE 1 CAPSULE(40 MG) BY MOUTH DAILY    Dispense:  30 capsule    Refill:  2    This patient was seen by Drema Dallas, PA-C in collaboration with Dr. Clayborn Bigness as a part of collaborative care agreement.   Total time spent:30 Minutes Time spent includes review of chart, medications, test results, and follow up plan with the patient.      Dr Lavera Guise Internal medicine

## 2021-12-16 ENCOUNTER — Other Ambulatory Visit: Payer: Self-pay | Admitting: Physician Assistant

## 2021-12-16 ENCOUNTER — Encounter: Payer: Self-pay | Admitting: Physician Assistant

## 2021-12-16 ENCOUNTER — Telehealth: Payer: Self-pay

## 2021-12-16 ENCOUNTER — Telehealth: Payer: Self-pay | Admitting: Physician Assistant

## 2021-12-16 DIAGNOSIS — N921 Excessive and frequent menstruation with irregular cycle: Secondary | ICD-10-CM

## 2021-12-16 DIAGNOSIS — D500 Iron deficiency anemia secondary to blood loss (chronic): Secondary | ICD-10-CM

## 2021-12-16 LAB — LIPID PANEL WITH LDL/HDL RATIO
Cholesterol, Total: 151 mg/dL (ref 100–199)
HDL: 49 mg/dL (ref >39)
LDL Chol Calc (NIH): 89 mg/dL (ref 0–99)
LDL/HDL Ratio: 1.8 ratio (ref 0.0–3.2)
Triglycerides: 67 mg/dL (ref 0–149)
VLDL Cholesterol Cal: 13 mg/dL (ref 5–40)

## 2021-12-16 LAB — COMPREHENSIVE METABOLIC PANEL
ALT: 14 IU/L (ref 0–32)
AST: 19 IU/L (ref 0–40)
Albumin/Globulin Ratio: 1.7 (ref 1.2–2.2)
Albumin: 4.4 g/dL (ref 4.0–5.0)
Alkaline Phosphatase: 87 IU/L (ref 44–121)
BUN/Creatinine Ratio: 9 (ref 9–23)
BUN: 9 mg/dL (ref 6–20)
Bilirubin Total: 0.2 mg/dL (ref 0.0–1.2)
CO2: 27 mmol/L (ref 20–29)
Calcium: 9.6 mg/dL (ref 8.7–10.2)
Chloride: 102 mmol/L (ref 96–106)
Creatinine, Ser: 0.98 mg/dL (ref 0.57–1.00)
Globulin, Total: 2.6 g/dL (ref 1.5–4.5)
Glucose: 87 mg/dL (ref 70–99)
Potassium: 4.8 mmol/L (ref 3.5–5.2)
Sodium: 139 mmol/L (ref 134–144)
Total Protein: 7 g/dL (ref 6.0–8.5)
eGFR: 80 mL/min/{1.73_m2} (ref >59)

## 2021-12-16 LAB — IRON,TIBC AND FERRITIN PANEL
Ferritin: 24 ng/mL (ref 15–150)
Iron Saturation: 12 % — ABNORMAL LOW (ref 15–55)
Iron: 45 ug/dL (ref 27–159)
Total Iron Binding Capacity: 361 ug/dL (ref 250–450)
UIBC: 316 ug/dL (ref 131–425)

## 2021-12-16 LAB — CBC WITH DIFFERENTIAL/PLATELET
Basophils Absolute: 0.1 10*3/uL (ref 0.0–0.2)
Basos: 1 %
EOS (ABSOLUTE): 0.1 10*3/uL (ref 0.0–0.4)
Eos: 2 %
Hematocrit: 33 % — ABNORMAL LOW (ref 34.0–46.6)
Hemoglobin: 9.5 g/dL — ABNORMAL LOW (ref 11.1–15.9)
Immature Grans (Abs): 0 10*3/uL (ref 0.0–0.1)
Immature Granulocytes: 0 %
Lymphocytes Absolute: 2.2 10*3/uL (ref 0.7–3.1)
Lymphs: 34 %
MCH: 20.3 pg — ABNORMAL LOW (ref 26.6–33.0)
MCHC: 28.8 g/dL — ABNORMAL LOW (ref 31.5–35.7)
MCV: 71 fL — ABNORMAL LOW (ref 79–97)
Monocytes Absolute: 0.4 10*3/uL (ref 0.1–0.9)
Monocytes: 6 %
Neutrophils Absolute: 3.7 10*3/uL (ref 1.4–7.0)
Neutrophils: 57 %
Platelets: 405 10*3/uL (ref 150–450)
RBC: 4.67 x10E6/uL (ref 3.77–5.28)
RDW: 17.6 % — ABNORMAL HIGH (ref 11.7–15.4)
WBC: 6.4 10*3/uL (ref 3.4–10.8)

## 2021-12-16 LAB — VITAMIN D 25 HYDROXY (VIT D DEFICIENCY, FRACTURES): Vit D, 25-Hydroxy: 35.5 ng/mL (ref 30.0–100.0)

## 2021-12-16 LAB — HGB A1C W/O EAG: Hgb A1c MFr Bld: 5.9 % — ABNORMAL HIGH (ref 4.8–5.6)

## 2021-12-16 LAB — B12 AND FOLATE PANEL
Folate: 8 ng/mL (ref >3.0)
Vitamin B-12: 873 pg/mL (ref 232–1245)

## 2021-12-16 LAB — TSH+FREE T4
Free T4: 1.1 ng/dL (ref 0.82–1.77)
TSH: 2.06 u[IU]/mL (ref 0.450–4.500)

## 2021-12-16 NOTE — Telephone Encounter (Signed)
PA was approved for Pantoprazole sodium. Pt notified.

## 2021-12-16 NOTE — Telephone Encounter (Signed)
-----   Message from Mylinda Latina, PA-C sent at 12/16/2021  9:36 AM EDT ----- Please let her know her hemoglobin levels are low likely due to the heavy menstrual bleeding she described. Advise her to not overly exert herself and monitor for increasing weakness, fatigue, and SOB and to call or go to ED if this occurs as it could be a sign her levels are dropping further. She should go ahead and get in with OBGYN as soon as possible. I will also place referral to hematology. Her iron is also low and should supplement daily. Vit D borderline low and can supplement OTC. Her A1c was in prediabetic range and should monitor sugar/carb intake.

## 2021-12-16 NOTE — Telephone Encounter (Signed)
Spoke with patient regarding recent lab work and hematology referral. Patient was in compliance with advice.

## 2021-12-17 ENCOUNTER — Telehealth: Payer: Self-pay | Admitting: Physician Assistant

## 2021-12-17 NOTE — Telephone Encounter (Signed)
GYN referral faxed to [facility]; [phone] per patient's request-Toni

## 2021-12-25 NOTE — Telephone Encounter (Signed)
Error

## 2021-12-31 ENCOUNTER — Inpatient Hospital Stay: Payer: BC Managed Care – PPO | Attending: Oncology | Admitting: Oncology

## 2021-12-31 ENCOUNTER — Encounter: Payer: Self-pay | Admitting: Oncology

## 2021-12-31 ENCOUNTER — Inpatient Hospital Stay: Payer: BC Managed Care – PPO

## 2021-12-31 VITALS — BP 114/53 | HR 70 | Temp 96.7°F | Resp 16 | Ht 64.3 in | Wt 256.0 lb

## 2021-12-31 DIAGNOSIS — Z79899 Other long term (current) drug therapy: Secondary | ICD-10-CM | POA: Diagnosis not present

## 2021-12-31 DIAGNOSIS — D509 Iron deficiency anemia, unspecified: Secondary | ICD-10-CM | POA: Diagnosis present

## 2021-12-31 DIAGNOSIS — R531 Weakness: Secondary | ICD-10-CM | POA: Diagnosis not present

## 2021-12-31 DIAGNOSIS — D500 Iron deficiency anemia secondary to blood loss (chronic): Secondary | ICD-10-CM

## 2021-12-31 DIAGNOSIS — R5383 Other fatigue: Secondary | ICD-10-CM | POA: Diagnosis not present

## 2021-12-31 NOTE — Progress Notes (Signed)
[facility]  [phone]) [phone] [phone]) [phone]  ID: Rebecca Simmons OB: 1990/08/05  MR#: [id number]  GHW#:[id number]  Patient Care Team: Alan Ripper as PCP - General (Physician Assistant)  CHIEF COMPLAINT: Iron deficiency anemia.  INTERVAL HISTORY: Patient is a 31 year old female who has chronic heavy menses was noted to have declining hemoglobin and iron stores.  She has increased weakness and fatigue, but otherwise feels well.  She has no neurologic complaints.  She denies any recent fevers or illnesses.  She has a good appetite and denies weight loss.  She has no chest pain, shortness of breath, cough, or hemoptysis.  She denies any nausea, vomiting, constipation, or diarrhea.  She has no urinary complaints.  Patient offers no further specific complaints today.  REVIEW OF SYSTEMS:   Review of Systems  Constitutional:  Positive for malaise/fatigue. Negative for fever and weight loss.  Respiratory: Negative.  Negative for cough and hemoptysis.   Cardiovascular: Negative.  Negative for chest pain and leg swelling.  Gastrointestinal: Negative.  Negative for abdominal pain, blood in stool and melena.  Genitourinary: Negative.  Negative for hematuria.  Musculoskeletal: Negative.  Negative for back pain.  Skin: Negative.  Negative for rash.  Neurological:  Positive for weakness. Negative for dizziness, focal weakness and headaches.  Psychiatric/Behavioral: Negative.  The patient is not nervous/anxious.     As per HPI. Otherwise, a complete review of systems is negative.  PAST MEDICAL HISTORY: Past Medical History:  Diagnosis Date   Anxiety    Asthma    Depression    GERD (gastroesophageal reflux disease)     PAST SURGICAL HISTORY: Past Surgical History:  Procedure Laterality Date   FACIAL RECONSTRUCTION SURGERY     @31  years of age   TONSILLECTOMY      FAMILY HISTORY: Family History  Problem Relation Age of Onset   Rheum arthritis  Mother    Cancer Maternal Uncle    Seizures Maternal Uncle    Breast cancer Maternal Grandmother    Diabetes Maternal Grandmother    Stroke Maternal Grandmother    Cancer Paternal Grandfather     ADVANCED DIRECTIVES (Y/N):  N  HEALTH MAINTENANCE: Social History   Tobacco Use   Smoking status: Never   Smokeless tobacco: Never  Vaping Use   Vaping Use: Never used  Substance Use Topics   Alcohol use: Yes    Comment: socially   Drug use: No     Colonoscopy:  PAP:  Bone density:  Lipid panel:  No Known Allergies  Current Outpatient Medications  Medication Sig Dispense Refill   albuterol (PROAIR HFA) 108 (90 Base) MCG/ACT inhaler Inhale 1 to 2 puff by po 4 times daily as needed for wheezing/cough 1 each 3   FLUoxetine (PROZAC) 40 MG capsule TAKE 1 CAPSULE(40 MG) BY MOUTH DAILY 90 capsule 3   Multiple Vitamins-Minerals (MULTIVITAMIN ADULT) CHEW Chew by mouth daily.     omeprazole (PRILOSEC) 40 MG capsule Take 40 mg by mouth daily.     No current facility-administered medications for this visit.    OBJECTIVE: Vitals:   12/31/21 1124  BP: (!) 114/53  Pulse: 70  Resp: 16  Temp: (!) 96.7 F (35.9 C)  SpO2: 100%     Body mass index is 43.53 kg/m.    ECOG FS:0 - Asymptomatic  General: Well-developed, well-nourished, no acute distress. Eyes: Pink conjunctiva, anicteric sclera. HEENT: Normocephalic, moist mucous membranes. Lungs: No audible wheezing or coughing. Heart: Regular rate and rhythm. Abdomen:  Soft, nontender, no obvious distention. Musculoskeletal: No edema, cyanosis, or clubbing. Neuro: Alert, answering all questions appropriately. Cranial nerves grossly intact. Skin: No rashes or petechiae noted. Psych: Normal affect. Lymphatics: No cervical, calvicular, axillary or inguinal LAD.   LAB RESULTS:  Lab Results  Component Value Date   NA 139 12/15/2021   K 4.8 12/15/2021   CL 102 12/15/2021   CO2 27 12/15/2021   GLUCOSE 87 12/15/2021   BUN 9  12/15/2021   CREATININE 0.98 12/15/2021   CALCIUM 9.6 12/15/2021   PROT 7.0 12/15/2021   ALBUMIN 4.4 12/15/2021   AST 19 12/15/2021   ALT 14 12/15/2021   ALKPHOS 87 12/15/2021   BILITOT 0.2 12/15/2021   GFRNONAA >60 01/24/2018   GFRAA >60 01/24/2018    Lab Results  Component Value Date   WBC 6.4 12/15/2021   NEUTROABS 3.7 12/15/2021   HGB 9.5 (L) 12/15/2021   HCT 33.0 (L) 12/15/2021   MCV 71 (L) 12/15/2021   PLT 405 12/15/2021   Lab Results  Component Value Date   IRON 45 12/15/2021   TIBC 361 12/15/2021   IRONPCTSAT 12 (L) 12/15/2021   Lab Results  Component Value Date   FERRITIN 24 12/15/2021     STUDIES: No results found.  ASSESSMENT: Iron deficiency anemia.  PLAN:    Iron deficiency anemia: Secondary to heavy menses.  Patient's hemoglobin and iron stores are decreased and she is symptomatic.  She will return to clinic 5 times over the next 2 to 3 weeks to receive 200 mg IV Venofer.  She will then return to clinic in 3 months with repeat laboratory work, further evaluation, and consideration of additional treatment if needed. Heavy menses: Patient reports she has an appointment with gynecology coming up in the next month.  I spent a total of 45 minutes reviewing chart data, face-to-face evaluation with the patient, counseling and coordination of care as detailed above.   Patient expressed understanding and was in agreement with this plan. She also understands that She can call clinic at any time with any questions, concerns, or complaints.    Lloyd Huger, MD   12/31/2021 4:37 PM

## 2022-01-02 ENCOUNTER — Inpatient Hospital Stay: Payer: BC Managed Care – PPO

## 2022-01-02 VITALS — BP 134/69 | HR 67 | Temp 97.0°F | Resp 18

## 2022-01-02 DIAGNOSIS — D500 Iron deficiency anemia secondary to blood loss (chronic): Secondary | ICD-10-CM

## 2022-01-02 DIAGNOSIS — D509 Iron deficiency anemia, unspecified: Secondary | ICD-10-CM | POA: Diagnosis not present

## 2022-01-02 MED ORDER — SODIUM CHLORIDE 0.9 % IV SOLN
Freq: Once | INTRAVENOUS | Status: AC
  Filled 2022-01-02: qty 250

## 2022-01-02 MED ORDER — SODIUM CHLORIDE 0.9 % IV SOLN
200.0000 mg | Freq: Once | INTRAVENOUS | Status: AC
  Administered 2022-01-02: 200 mg via INTRAVENOUS
  Filled 2022-01-02: qty 200

## 2022-01-06 ENCOUNTER — Ambulatory Visit
Admission: EM | Admit: 2022-01-06 | Discharge: 2022-01-06 | Discharge status: Against Medical Advice | Payer: BC Managed Care – PPO

## 2022-01-06 ENCOUNTER — Emergency Department: Payer: BC Managed Care – PPO

## 2022-01-06 ENCOUNTER — Other Ambulatory Visit: Payer: Self-pay

## 2022-01-06 ENCOUNTER — Encounter: Payer: Self-pay | Admitting: Emergency Medicine

## 2022-01-06 ENCOUNTER — Encounter: Payer: Self-pay | Admitting: Oncology

## 2022-01-06 ENCOUNTER — Telehealth: Payer: Self-pay

## 2022-01-06 ENCOUNTER — Emergency Department
Admission: EM | Admit: 2022-01-06 | Discharge: 2022-01-06 | Disposition: A | Discharge status: Home / Self Care | Payer: BC Managed Care – PPO | Attending: Emergency Medicine | Admitting: Emergency Medicine

## 2022-01-06 DIAGNOSIS — L03213 Periorbital cellulitis: Secondary | ICD-10-CM | POA: Insufficient documentation

## 2022-01-06 DIAGNOSIS — J45909 Unspecified asthma, uncomplicated: Secondary | ICD-10-CM | POA: Insufficient documentation

## 2022-01-06 LAB — COMPREHENSIVE METABOLIC PANEL
ALT: 19 U/L (ref 0–44)
AST: 29 U/L (ref 15–41)
Albumin: 3.9 g/dL (ref 3.5–5.0)
Alkaline Phosphatase: 62 U/L (ref 38–126)
Anion gap: 6 (ref 5–15)
BUN: 9 mg/dL (ref 6–20)
CO2: 28 mmol/L (ref 22–32)
Calcium: 9.1 mg/dL (ref 8.9–10.3)
Chloride: 104 mmol/L (ref 98–111)
Creatinine, Ser: 0.86 mg/dL (ref 0.44–1.00)
GFR, Estimated: >60 mL/min (ref >60)
Glucose, Bld: 100 mg/dL — ABNORMAL HIGH (ref 70–99)
Potassium: 4 mmol/L (ref 3.5–5.1)
Sodium: 138 mmol/L (ref 135–145)
Total Bilirubin: 0.4 mg/dL (ref 0.3–1.2)
Total Protein: 7.5 g/dL (ref 6.5–8.1)

## 2022-01-06 LAB — CBC WITH DIFFERENTIAL/PLATELET
Abs Immature Granulocytes: 0.03 10*3/uL (ref 0.00–0.07)
Basophils Absolute: 0.1 10*3/uL (ref 0.0–0.1)
Basophils Relative: 1 %
Eosinophils Absolute: 0.2 10*3/uL (ref 0.0–0.5)
Eosinophils Relative: 2 %
HCT: 30.6 % — ABNORMAL LOW (ref 36.0–46.0)
Hemoglobin: 9.1 g/dL — ABNORMAL LOW (ref 12.0–15.0)
Immature Granulocytes: 0 %
Lymphocytes Relative: 27 %
Lymphs Abs: 2.1 10*3/uL (ref 0.7–4.0)
MCH: 20.4 pg — ABNORMAL LOW (ref 26.0–34.0)
MCHC: 29.7 g/dL — ABNORMAL LOW (ref 30.0–36.0)
MCV: 68.6 fL — ABNORMAL LOW (ref 80.0–100.0)
Monocytes Absolute: 0.5 10*3/uL (ref 0.1–1.0)
Monocytes Relative: 7 %
Neutro Abs: 5.1 10*3/uL (ref 1.7–7.7)
Neutrophils Relative %: 63 %
Platelets: 337 10*3/uL (ref 150–400)
RBC: 4.46 MIL/uL (ref 3.87–5.11)
RDW: 19.4 % — ABNORMAL HIGH (ref 11.5–15.5)
WBC: 8 10*3/uL (ref 4.0–10.5)
nRBC: 0 % (ref 0.0–0.2)

## 2022-01-06 MED ORDER — MORPHINE SULFATE (PF) 4 MG/ML IV SOLN
4.0000 mg | Freq: Once | INTRAVENOUS | Status: AC
  Administered 2022-01-06: 4 mg via INTRAVENOUS
  Filled 2022-01-06: qty 1

## 2022-01-06 MED ORDER — SODIUM CHLORIDE 0.9 % IV SOLN
1.0000 g | Freq: Once | INTRAVENOUS | Status: AC
  Administered 2022-01-06: 1 g via INTRAVENOUS
  Filled 2022-01-06: qty 10

## 2022-01-06 MED ORDER — FLUCONAZOLE 150 MG PO TABS: ORAL_TABLET | ORAL | 0 refills | Status: DC

## 2022-01-06 MED ORDER — ONDANSETRON HCL 4 MG/2ML IJ SOLN
4.0000 mg | Freq: Once | INTRAMUSCULAR | Status: AC
  Administered 2022-01-06: 4 mg via INTRAVENOUS
  Filled 2022-01-06: qty 2

## 2022-01-06 MED ORDER — IOHEXOL 300 MG/ML  SOLN
75.0000 mL | Freq: Once | INTRAMUSCULAR | Status: AC | PRN
  Administered 2022-01-06: 75 mL via INTRAVENOUS

## 2022-01-06 MED ORDER — AMOXICILLIN-POT CLAVULANATE 875-125 MG PO TABS: 1.0000 | ORAL_TABLET | Freq: Two times a day (BID) | ORAL | 0 refills | Status: DC

## 2022-01-06 NOTE — Discharge Instructions (Signed)
Follow-up with [facility].  Please call them for an appointment.  Dr. Lazarus Salines is on-call and has been notified that you need a follow-up appointment. Take the antibiotic as prescribed Tylenol and ibuprofen for pain as needed Return if you are worsening

## 2022-01-06 NOTE — ED Triage Notes (Signed)
Pt sts that she was sent here for a orbital Ct for cellulitis. Pt sts that she has been on abx for it but today has gotten worse with pain and swelling.

## 2022-01-06 NOTE — Telephone Encounter (Signed)
Patient's P.A. for Pantoprazole was approved.

## 2022-01-06 NOTE — ED Provider Notes (Signed)
[facility] Provider Note    Event Date/Time   First MD Initiated Contact with Patient 01/06/22 1441     (approximate)   History   Eye Drainage   HPI  Rebecca Simmons is a 31 y.o. female with history of asthma and GERD presents emergency department with concerns of orbital cellulitis.  Patient was seen at her eye doctor and was told to come emergency department.  She originally had some redness and swelling on the left eyelid.  Was given tobramycin ophthalmic drops and saline eyedrops.  Has applied a warm compress but the eye has become much more swollen and is now painful for her to move her eye.  No fever or chills.  No change in her vision.      Physical Exam   Triage Vital Signs: ED Triage Vitals  Enc Vitals Group     BP 01/06/22 1308 113/73     Pulse Rate 01/06/22 1308 62     Resp 01/06/22 1308 18     Temp 01/06/22 1308 98.4 F (36.9 C)     Temp Source 01/06/22 1308 Oral     SpO2 01/06/22 1308 100 %     Weight 01/06/22 1321 256 lb (116.1 kg)     Height 01/06/22 1440 5\' 4"  (1.626 m)     Head Circumference --      Peak Flow --      Pain Score 01/06/22 1321 8     Pain Loc --      Pain Edu? --      Excl. in [place]? --     Most recent vital signs: Vitals:   01/06/22 1308  BP: 113/73  Pulse: 62  Resp: 18  Temp: 98.4 F (36.9 C)  SpO2: 100%     General: Awake, no distress.   CV:  Good peripheral perfusion. regular rate and  rhythm Resp:  Normal effort.  Abd:  No distention.   Other:  Left eye with EOMI, lid and area around the eye are red tender and swollen, exudate noted in the corner of the medial aspect of the eye.  No actual stye appreciated.   ED Results / Procedures / Treatments   Labs (Simmons labs ordered are listed, but only abnormal results are displayed) Labs Reviewed  COMPREHENSIVE METABOLIC PANEL - Abnormal; Notable for the following components:      Result Value   Glucose, Bld 100 (*)    Simmons other components within  normal limits  CBC WITH DIFFERENTIAL/PLATELET - Abnormal; Notable for the following components:   Hemoglobin 9.1 (*)    HCT 30.6 (*)    MCV 68.6 (*)    MCH 20.4 (*)    MCHC 29.7 (*)    RDW 19.4 (*)    Simmons other components within normal limits     EKG     RADIOLOGY CT with contrast of the orbits    PROCEDURES:   Procedures   MEDICATIONS ORDERED IN ED: Medications  cefTRIAXone (ROCEPHIN) 1 g in sodium chloride 0.9 % 100 mL IVPB (1 g Intravenous New Bag/Given 01/06/22 1627)  morphine (PF) 4 MG/ML injection 4 mg (4 mg Intravenous Given 01/06/22 1538)  ondansetron (ZOFRAN) injection 4 mg (4 mg Intravenous Given 01/06/22 1538)  iohexol (OMNIPAQUE) 300 MG/ML solution 75 mL (75 mLs Intravenous Contrast Given 01/06/22 1602)     IMPRESSION / MDM / ASSESSMENT AND PLAN / ED COURSE  I reviewed the triage vital signs and the nursing notes.  Differential diagnosis includes, but is not limited to, orbital cellulitis, preseptal cellulitis, clogged duct, stye  Patient's presentation is most consistent with acute presentation with potential threat to life or bodily function.   Labs are reassuring even though the patient is anemic, this is in her normal trend  CT of the orbits with contrast independently reviewed and interpreted by me as being negative for orbital cellulitis, radiologist comments that his preseptal cellulitis  I did explain these findings to the patient and her mother.  Patient was given morphine 4 mg IV and Zofran 4 mg IV for pain.  Rocephin 1 g IV for cellulitis.  She will be discharged with a prescription for Augmentin 875 twice daily for 7 days and Diflucan 150 mg as needed for yeast.  She is in stable condition at discharge.  Secure message was sent to Dr. Rolley Sims that she would need a follow-up appointment for preseptal cellulitis.  Patient was also given Dr. Marlana Latus information.      FINAL CLINICAL IMPRESSION(S) / ED DIAGNOSES    Final diagnoses:  Preseptal cellulitis of left eye     Rx / DC Orders   ED Discharge Orders          Ordered    amoxicillin-clavulanate (AUGMENTIN) 875-125 MG tablet  2 times daily        01/06/22 1631    fluconazole (DIFLUCAN) 150 MG tablet        01/06/22 1636             Note:  This document was prepared using Dragon voice recognition software and may include unintentional dictation errors.    Faythe Ghee, PA-C 01/06/22 1641    Merwyn Katos, MD 01/06/22 2351

## 2022-01-07 ENCOUNTER — Inpatient Hospital Stay: Payer: BC Managed Care – PPO

## 2022-01-07 ENCOUNTER — Telehealth: Payer: Self-pay | Admitting: Physician Assistant

## 2022-01-07 VITALS — BP 113/60 | HR 67 | Temp 97.4°F | Resp 17

## 2022-01-07 DIAGNOSIS — D509 Iron deficiency anemia, unspecified: Secondary | ICD-10-CM | POA: Diagnosis not present

## 2022-01-07 DIAGNOSIS — D500 Iron deficiency anemia secondary to blood loss (chronic): Secondary | ICD-10-CM

## 2022-01-07 MED ORDER — SODIUM CHLORIDE 0.9 % IV SOLN
200.0000 mg | Freq: Once | INTRAVENOUS | Status: AC
  Administered 2022-01-07: 200 mg via INTRAVENOUS
  Filled 2022-01-07: qty 200

## 2022-01-07 MED ORDER — SODIUM CHLORIDE 0.9 % IV SOLN
Freq: Once | INTRAVENOUS | Status: AC
  Filled 2022-01-07: qty 250

## 2022-01-07 NOTE — Telephone Encounter (Signed)
Lvm to schedule ED follow up-Toni 

## 2022-01-08 MED FILL — Iron Sucrose Inj 20 MG/ML (Fe Equiv): INTRAVENOUS | Qty: 10 | Status: AC

## 2022-01-09 ENCOUNTER — Telehealth: Payer: Self-pay | Admitting: Physician Assistant

## 2022-01-09 ENCOUNTER — Inpatient Hospital Stay: Payer: BC Managed Care – PPO

## 2022-01-09 VITALS — BP 127/74 | HR 68 | Temp 97.1°F | Resp 17

## 2022-01-09 DIAGNOSIS — D500 Iron deficiency anemia secondary to blood loss (chronic): Secondary | ICD-10-CM

## 2022-01-09 DIAGNOSIS — D509 Iron deficiency anemia, unspecified: Secondary | ICD-10-CM | POA: Diagnosis not present

## 2022-01-09 MED ORDER — SODIUM CHLORIDE 0.9 % IV SOLN
Freq: Once | INTRAVENOUS | Status: AC
  Filled 2022-01-09: qty 250

## 2022-01-09 MED ORDER — SODIUM CHLORIDE 0.9 % IV SOLN
200.0000 mg | Freq: Once | INTRAVENOUS | Status: AC
  Administered 2022-01-09: 200 mg via INTRAVENOUS
  Filled 2022-01-09: qty 200

## 2022-01-09 NOTE — Telephone Encounter (Signed)
Left 2nd vm to schedule ED follow up-Toni

## 2022-01-14 ENCOUNTER — Inpatient Hospital Stay: Payer: BC Managed Care – PPO

## 2022-01-14 VITALS — BP 136/75 | HR 72 | Temp 96.9°F | Resp 18

## 2022-01-14 DIAGNOSIS — D500 Iron deficiency anemia secondary to blood loss (chronic): Secondary | ICD-10-CM

## 2022-01-14 DIAGNOSIS — D509 Iron deficiency anemia, unspecified: Secondary | ICD-10-CM | POA: Diagnosis not present

## 2022-01-14 MED ORDER — SODIUM CHLORIDE 0.9 % IV SOLN
200.0000 mg | Freq: Once | INTRAVENOUS | Status: AC
  Administered 2022-01-14: 200 mg via INTRAVENOUS
  Filled 2022-01-14: qty 200

## 2022-01-14 MED ORDER — SODIUM CHLORIDE 0.9 % IV SOLN
Freq: Once | INTRAVENOUS | Status: AC
  Filled 2022-01-14: qty 250

## 2022-01-14 NOTE — Patient Instructions (Signed)

## 2022-01-15 MED FILL — Iron Sucrose Inj 20 MG/ML (Fe Equiv): INTRAVENOUS | Qty: 10 | Status: AC

## 2022-01-16 ENCOUNTER — Inpatient Hospital Stay: Payer: BC Managed Care – PPO

## 2022-01-16 VITALS — BP 131/72 | HR 76 | Temp 97.3°F | Resp 18

## 2022-01-16 DIAGNOSIS — D500 Iron deficiency anemia secondary to blood loss (chronic): Secondary | ICD-10-CM

## 2022-01-16 DIAGNOSIS — D509 Iron deficiency anemia, unspecified: Secondary | ICD-10-CM | POA: Diagnosis not present

## 2022-01-16 MED ORDER — SODIUM CHLORIDE 0.9 % IV SOLN
Freq: Once | INTRAVENOUS | Status: AC
  Filled 2022-01-16: qty 250

## 2022-01-16 MED ORDER — SODIUM CHLORIDE 0.9 % IV SOLN
200.0000 mg | Freq: Once | INTRAVENOUS | Status: AC
  Administered 2022-01-16: 200 mg via INTRAVENOUS
  Filled 2022-01-16: qty 200

## 2022-01-16 NOTE — Patient Instructions (Signed)

## 2022-01-19 ENCOUNTER — Ambulatory Visit: Payer: BC Managed Care – PPO | Admitting: Certified Nurse Midwife

## 2022-01-19 ENCOUNTER — Encounter: Payer: Self-pay | Admitting: Certified Nurse Midwife

## 2022-01-19 ENCOUNTER — Ambulatory Visit (INDEPENDENT_AMBULATORY_CARE_PROVIDER_SITE_OTHER): Payer: BC Managed Care – PPO | Admitting: Certified Nurse Midwife

## 2022-01-19 VITALS — BP 118/80 | HR 78 | Resp 16 | Wt 253.9 lb

## 2022-01-19 DIAGNOSIS — N921 Excessive and frequent menstruation with irregular cycle: Secondary | ICD-10-CM

## 2022-01-19 DIAGNOSIS — N920 Excessive and frequent menstruation with regular cycle: Secondary | ICD-10-CM

## 2022-01-19 MED ORDER — TRANEXAMIC ACID 650 MG PO TABS: 1300.0000 mg | ORAL_TABLET | Freq: Three times a day (TID) | ORAL | 0 refills | Status: AC

## 2022-01-19 NOTE — Progress Notes (Signed)
GYN ENCOUNTER NOTE  Subjective:       Rebecca Simmons is a 31 y.o. G0P0000 female is here for gynecologic evaluation of the following issues:  1. Heavy periods. This is an ongoing problems for her. She was seen 09/30/20 and discussed treatment options declined birth control due to concerns about side effects of medications. States that it has become worse. That she has been bleeding continuously since July . She is now anemic and has received iron transfusions. States that it goes from spotting to heavy with clots size of quarter to size of grape fruit. She also notes that she will sometimes skip up to 2 months. Not sure how often this occurs.    Gynecologic History Patient's last menstrual period was 10/07/2021. Contraception: none Last Pap: 04/12/2020. Results were: normal Last mammogram: n/a .   Obstetric History OB History  Gravida Para Term Preterm AB Living  0 0 0 0 0 0  SAB IAB Ectopic Multiple Live Births  0 0 0 0 0    Past Medical History:  Diagnosis Date   Anxiety    Asthma    Depression    GERD (gastroesophageal reflux disease)     Past Surgical History:  Procedure Laterality Date   FACIAL RECONSTRUCTION SURGERY     @31  years of age   TONSILLECTOMY      Current Outpatient Medications on File Prior to Visit  Medication Sig Dispense Refill   albuterol (PROAIR HFA) 108 (90 Base) MCG/ACT inhaler Inhale 1 to 2 puff by po 4 times daily as needed for wheezing/cough 1 each 3   amoxicillin-clavulanate (AUGMENTIN) 875-125 MG tablet Take 1 tablet by mouth 2 (two) times daily. 14 tablet 0   fluconazole (DIFLUCAN) 150 MG tablet Take one now and one in a week 2 tablet 0   FLUoxetine (PROZAC) 40 MG capsule TAKE 1 CAPSULE(40 MG) BY MOUTH DAILY 90 capsule 3   Multiple Vitamins-Minerals (MULTIVITAMIN ADULT) CHEW Chew by mouth daily.     omeprazole (PRILOSEC) 40 MG capsule Take 40 mg by mouth daily.     No current facility-administered medications on file prior to visit.    No  Known Allergies  Social History   Socioeconomic History   Marital status: Married    Spouse name: Not on file   Number of children: Not on file   Years of education: Not on file   Highest education level: Not on file  Occupational History   Not on file  Tobacco Use   Smoking status: Never   Smokeless tobacco: Never  Vaping Use   Vaping Use: Never used  Substance and Sexual Activity   Alcohol use: Yes    Comment: socially   Drug use: No   Sexual activity: Yes    Birth control/protection: None  Other Topics Concern   Not on file  Social History Narrative   Not on file   Social Determinants of Health   Financial Resource Strain: Not on file  Food Insecurity: Not on file  Transportation Needs: Not on file  Physical Activity: Not on file  Stress: Not on file  Social Connections: Not on file  Intimate Partner Violence: Not on file    Family History  Problem Relation Age of Onset   Rheum arthritis Mother    Cancer Maternal Uncle    Seizures Maternal Uncle    Breast cancer Maternal Grandmother    Diabetes Maternal Grandmother    Stroke Maternal Grandmother    Cancer Paternal Grandfather  The following portions of the patient's history were reviewed and updated as appropriate: allergies, current medications, past family history, past medical history, past social history, past surgical history and problem list.  Review of Systems Review of Systems - Negative except heavy menstrual bleeding Review of Systems - General ROS: negative for - chills, fatigue, fever, hot flashes, malaise or night sweats Hematological and Lymphatic ROS: negative for - bleeding problems or swollen lymph nodes Gastrointestinal ROS: negative for - abdominal pain, blood in stools, change in bowel habits and nausea/vomiting Musculoskeletal ROS: negative for - joint pain, muscle pain or muscular weakness Genito-Urinary ROS: negative for - change in menstrual cycle, dysmenorrhea, dyspareunia,  dysuria, genital discharge, genital ulcers, hematuria, incontinence, , nocturia or pelvic pain. Positive for irregular/heavy menses  Objective:   LMP 10/07/2021  CONSTITUTIONAL: Well-developed, well-nourished female in no acute distress.  HENT:  Normocephalic, atraumatic.  NECK: Normal range of motion, supple, no masses.  Normal thyroid.  SKIN: Skin is warm and dry. No rash noted. Not diaphoretic. No erythema. No pallor. NEUROLGIC: Alert and oriented to person, place, and time. PSYCHIATRIC: Normal mood and affect. Normal behavior. Normal judgment and thought content. CARDIOVASCULAR:Not Examined RESPIRATORY: Not Examined BREASTS: Not Examined ABDOMEN: Soft, non distended; Non tender.  No Organomegaly. PELVIC: not necessay, pt will have pelvic u/s  MUSCULOSKELETAL: Normal range of motion. No tenderness.  No cyanosis, clubbing, or edema.   Assessment:   Menorrhagia with irregular cycle    Plan:   Given pt has not had u/s since 2020, recommend pelvic u/s to evaluate endometrium and r/o fiborids. Pt states her mother has fibroids. Discussed treatment options. She declines hormonal options. She will try a course of lysteda. She is interested in surgical options. Discussed that ablation not recommended for her due to her desire for child bearing. Discussed possible D & C. Will review u/s results and consult with MD . She agrees to a course of lysteda. She denies any contraindications to use. Orders placed. Will follow up with u/s results .   Doreene Burke, CNM

## 2022-01-19 NOTE — Patient Instructions (Signed)
Abnormal Uterine Bleeding Abnormal uterine bleeding means bleeding more than normal from your womb (uterus). It can include: Bleeding after sex. Bleeding between monthly (menstrual) periods. Bleeding that is heavier than normal. Monthly periods that last longer than normal. Bleeding after you have stopped having your monthly period (menopause). You should see a doctor for any kind of bleeding that is not normal. Treatment depends on the cause of your bleeding and how much you bleed. Follow these instructions at home: Medicines Take over-the-counter and prescription medicines only as told by your doctor. Ask your doctor about: Taking medicines such as aspirin and ibuprofen. Do not take these medicines unless your doctor tells you to take them. Taking over-the-counter medicines, vitamins, herbs, and supplements. You may be given iron pills. Take them as told by your doctor. Managing constipation If you take iron pills, you may need to take these actions to prevent or treat trouble pooping (constipation): Drink enough fluid to keep your pee (urine) pale yellow. Take over-the-counter or prescription medicines. Eat foods that are high in fiber. These include beans, whole grains, and fresh fruits and vegetables. Limit foods that are high in fat and sugar. These include fried or sweet foods. Activity Change your activity to decrease bleeding if you need to change your sanitary pad more than one time every 2 hours: Lie in bed with your feet raised (elevated). Place a cold pack on your lower belly. Rest as much as you are able until the bleeding stops or slows down. General instructions Do not use tampons, douche, or have sex until your doctor says these things are okay. Change your pads often. Get regular exams. These include: Pelvic exams. Screenings for cancer of the cervix. It is up to you to get the results of any tests that are done. Ask how to get your results when they are  ready. Watch for any changes in your bleeding. For 2 months, write down: When your monthly period starts. When your monthly period ends. When you get any abnormal bleeding from your vagina. What problems you notice. Keep all follow-up visits. Contact a doctor if: The bleeding lasts more than one week. You feel dizzy at times. You feel like you may vomit (nausea). You vomit. You feel light-headed or weak. Your symptoms get worse. Get help right away if: You faint. You have to change pads every hour. You have pain in your belly. You have a fever or chills. You get sweaty or weak. You pass large blood clots from your vagina. These symptoms may be an emergency. Get help right away. Call your local emergency services (911 in the [place] Do not wait to see if the symptoms will go away. Do not drive yourself to the hospital. Summary Abnormal uterine bleeding means bleeding more than normal from your womb (uterus). Any kind of bleeding that is not normal should be checked by a doctor. Treatment depends on the cause of your bleeding and how much you bleed. Get help right away if you faint, you have to change pads every hour, or you pass large blood clots from your vagina. This information is not intended to replace advice given to you by your health care provider. Make sure you discuss any questions you have with your health care provider. Document Revised: 06/18/2020 Document Reviewed: 06/18/2020 Elsevier Patient Education  2023 Elsevier Inc.  

## 2022-01-27 ENCOUNTER — Ambulatory Visit: Payer: BC Managed Care – PPO | Admitting: Certified Nurse Midwife

## 2022-02-05 ENCOUNTER — Encounter: Payer: Self-pay | Admitting: Certified Nurse Midwife

## 2022-02-05 NOTE — Telephone Encounter (Signed)
Please advise 

## 2022-02-06 MED ORDER — TRAMADOL HCL 50 MG PO TABS: 50.0000 mg | ORAL_TABLET | Freq: Four times a day (QID) | ORAL | 1 refills | Status: DC | PRN

## 2022-02-06 MED ORDER — MEDROXYPROGESTERONE ACETATE 10 MG PO TABS: 10.0000 mg | ORAL_TABLET | Freq: Every day | ORAL | 2 refills | Status: AC

## 2022-02-10 ENCOUNTER — Ambulatory Visit (INDEPENDENT_AMBULATORY_CARE_PROVIDER_SITE_OTHER): Payer: BC Managed Care – PPO

## 2022-02-10 DIAGNOSIS — N921 Excessive and frequent menstruation with irregular cycle: Secondary | ICD-10-CM | POA: Diagnosis not present

## 2022-02-12 ENCOUNTER — Telehealth: Payer: Self-pay

## 2022-02-12 NOTE — Telephone Encounter (Signed)
PT calling following up on U/S report, was told Dr Valentino Saxon would call to see if she qualify's for surgery.

## 2022-02-12 NOTE — Telephone Encounter (Signed)
I was unaware of that I was supposed to be informing her ultrasound results as I haven't seen this patient.  Was only responding to her last message as Pattricia Boss was out of town. Also, she doesn't have any future appointments scheduled. However it does appear that she has a polyp and may need to consider surgical intervention.  She should follow up with either myself or Dr. Logan Bores for further discussion.   Dr. Valentino Saxon

## 2022-02-16 ENCOUNTER — Encounter: Payer: Self-pay | Admitting: Physician Assistant

## 2022-02-16 ENCOUNTER — Encounter: Payer: Self-pay | Admitting: Oncology

## 2022-02-17 ENCOUNTER — Encounter: Payer: Self-pay | Admitting: Certified Nurse Midwife

## 2022-02-17 ENCOUNTER — Other Ambulatory Visit: Payer: Self-pay | Admitting: Certified Nurse Midwife

## 2022-02-18 ENCOUNTER — Inpatient Hospital Stay: Payer: BC Managed Care – PPO | Attending: Oncology

## 2022-02-18 DIAGNOSIS — D509 Iron deficiency anemia, unspecified: Secondary | ICD-10-CM | POA: Insufficient documentation

## 2022-02-18 DIAGNOSIS — D500 Iron deficiency anemia secondary to blood loss (chronic): Secondary | ICD-10-CM

## 2022-02-18 LAB — IRON AND TIBC
Iron: 52 ug/dL (ref 28–170)
Saturation Ratios: 16 % (ref 10.4–31.8)
TIBC: 332 ug/dL (ref 250–450)
UIBC: 280 ug/dL

## 2022-02-18 LAB — CBC WITH DIFFERENTIAL/PLATELET
Abs Immature Granulocytes: 0.02 10*3/uL (ref 0.00–0.07)
Basophils Absolute: 0.1 10*3/uL (ref 0.0–0.1)
Basophils Relative: 1 %
Eosinophils Absolute: 0.1 10*3/uL (ref 0.0–0.5)
Eosinophils Relative: 1 %
HCT: 31.1 % — ABNORMAL LOW (ref 36.0–46.0)
Hemoglobin: 9.7 g/dL — ABNORMAL LOW (ref 12.0–15.0)
Immature Granulocytes: 0 %
Lymphocytes Relative: 27 %
Lymphs Abs: 1.9 10*3/uL (ref 0.7–4.0)
MCH: 23 pg — ABNORMAL LOW (ref 26.0–34.0)
MCHC: 31.2 g/dL (ref 30.0–36.0)
MCV: 73.9 fL — ABNORMAL LOW (ref 80.0–100.0)
Monocytes Absolute: 0.4 10*3/uL (ref 0.1–1.0)
Monocytes Relative: 6 %
Neutro Abs: 4.5 10*3/uL (ref 1.7–7.7)
Neutrophils Relative %: 65 %
Platelets: 379 10*3/uL (ref 150–400)
RBC: 4.21 MIL/uL (ref 3.87–5.11)
RDW: 21 % — ABNORMAL HIGH (ref 11.5–15.5)
WBC: 7 10*3/uL (ref 4.0–10.5)
nRBC: 0 % (ref 0.0–0.2)

## 2022-02-18 LAB — FERRITIN: Ferritin: 87 ng/mL (ref 11–307)

## 2022-02-20 LAB — HGB FRACTIONATION CASCADE
Hgb A2: 2 % (ref 1.8–3.2)
Hgb A: 98 % (ref 96.4–98.8)
Hgb F: 0 % (ref 0.0–2.0)
Hgb S: 0 %

## 2022-03-19 ENCOUNTER — Ambulatory Visit (INDEPENDENT_AMBULATORY_CARE_PROVIDER_SITE_OTHER): Payer: BC Managed Care – PPO | Admitting: Physician Assistant

## 2022-03-19 ENCOUNTER — Encounter: Payer: Self-pay | Admitting: Physician Assistant

## 2022-03-19 VITALS — BP 118/73 | HR 73 | Temp 98.5°F | Resp 16 | Ht 64.3 in | Wt 258.4 lb

## 2022-03-19 DIAGNOSIS — N921 Excessive and frequent menstruation with irregular cycle: Secondary | ICD-10-CM

## 2022-03-19 DIAGNOSIS — D500 Iron deficiency anemia secondary to blood loss (chronic): Secondary | ICD-10-CM | POA: Diagnosis not present

## 2022-03-19 DIAGNOSIS — F331 Major depressive disorder, recurrent, moderate: Secondary | ICD-10-CM

## 2022-03-19 MED ORDER — OMEPRAZOLE 40 MG PO CPDR: 40.0000 mg | DELAYED_RELEASE_CAPSULE | Freq: Every day | ORAL | 1 refills | Status: DC

## 2022-03-19 NOTE — Progress Notes (Signed)
[facility] [place], [place] [address]  Internal MEDICINE  Office Visit Note  Patient Name: Rebecca Simmons  [id number]  [id number]  Date of Service: 03/25/2022  Chief Complaint  Patient presents with   Follow-up   Depression   Gastroesophageal Reflux    HPI Pt is here for routine follow up -Did have some iron infusions and was told could follow up in a few months -Went to see OBGYN for heavy menstrual bleeding and told concern for polyp/firboid and would need surgery but couldn't get her in yet and in meantime got in with new [facility] provider and had hysteroscopy in which they removed polyp -Since removal she is having some cramping and spotting which she was told may happen and will have her scheduled follow up. Put IUD in with procedure as well.  -Working out once per week for about 1 hour.  -Goes back to hematology Feb 1st  Current Medication: Outpatient Encounter Medications as of 03/19/2022  Medication Sig   albuterol (PROAIR HFA) 108 (90 Base) MCG/ACT inhaler Inhale 1 to 2 puff by po 4 times daily as needed for wheezing/cough   ferrous sulfate 325 (65 FE) MG EC tablet Take 325 mg by mouth daily.   FLUoxetine (PROZAC) 40 MG capsule TAKE 1 CAPSULE(40 MG) BY MOUTH DAILY   medroxyPROGESTERone (PROVERA) 10 MG tablet Take 1 tablet (10 mg total) by mouth daily. Use for ten days   Multiple Vitamins-Minerals (MULTIVITAMIN ADULT) CHEW Chew by mouth daily.   traMADol (ULTRAM) 50 MG tablet Take 1 tablet (50 mg total) by mouth every 6 (six) hours as needed.   [DISCONTINUED] omeprazole (PRILOSEC) 40 MG capsule Take 40 mg by mouth daily.   omeprazole (PRILOSEC) 40 MG capsule Take 1 capsule (40 mg total) by mouth daily.   No facility-administered encounter medications on file as of 03/19/2022.    Surgical History: Past Surgical History:  Procedure Laterality Date   FACIAL RECONSTRUCTION SURGERY     @32  years of age   TONSILLECTOMY      Medical History: Past  Medical History:  Diagnosis Date   Anxiety    Asthma    Depression    GERD (gastroesophageal reflux disease)     Family History: Family History  Problem Relation Age of Onset   Rheum arthritis Mother    Cancer Maternal Uncle    Seizures Maternal Uncle    Breast cancer Maternal Grandmother    Diabetes Maternal Grandmother    Stroke Maternal Grandmother    Cancer Paternal Grandfather     Social History   Socioeconomic History   Marital status: Married    Spouse name: Not on file   Number of children: Not on file   Years of education: Not on file   Highest education level: Not on file  Occupational History   Not on file  Tobacco Use   Smoking status: Never   Smokeless tobacco: Never  Vaping Use   Vaping Use: Never used  Substance and Sexual Activity   Alcohol use: Yes    Comment: socially   Drug use: No   Sexual activity: Yes    Birth control/protection: None  Other Topics Concern   Not on file  Social History Narrative   Not on file   Social Determinants of Health   Financial Resource Strain: Not on file  Food Insecurity: Not on file  Transportation Needs: Not on file  Physical Activity: Not on file  Stress: Not on file  Social  Connections: Not on file  Intimate Partner Violence: Not on file      Review of Systems  Constitutional:  Negative for chills, fatigue and unexpected weight change.  HENT:  Negative for congestion, postnasal drip, rhinorrhea, sneezing and sore throat.   Eyes:  Negative for redness.  Respiratory:  Negative for cough, chest tightness and shortness of breath.   Cardiovascular:  Negative for chest pain and palpitations.  Gastrointestinal:  Negative for abdominal pain, constipation, diarrhea, nausea and vomiting.  Genitourinary:  Positive for menstrual problem. Negative for dysuria and frequency.  Musculoskeletal:  Negative for arthralgias, back pain, joint swelling and neck pain.  Skin:  Negative for rash.  Neurological: Negative.   Negative for tremors and numbness.  Hematological:  Negative for adenopathy. Does not bruise/bleed easily.  Psychiatric/Behavioral:  Positive for behavioral problems (Depression) and sleep disturbance. Negative for suicidal ideas. The patient is nervous/anxious.     Vital Signs: BP 118/73   Pulse 73   Temp 98.5 F (36.9 C)   Resp 16   Ht 5' 4.3" (1.633 m)   Wt 258 lb 6.4 oz (117.2 kg)   SpO2 99%   BMI 43.94 kg/m    Physical Exam Vitals and nursing note reviewed.  Constitutional:      General: She is not in acute distress.    Appearance: She is well-developed. She is obese. She is not diaphoretic.  HENT:     Head: Normocephalic and atraumatic.     Mouth/Throat:     Pharynx: No oropharyngeal exudate.  Eyes:     Pupils: Pupils are equal, round, and reactive to light.  Neck:     Thyroid: No thyromegaly.     Vascular: No JVD.     Trachea: No tracheal deviation.  Cardiovascular:     Rate and Rhythm: Normal rate and regular rhythm.     Heart sounds: Normal heart sounds. No murmur heard.    No friction rub. No gallop.  Pulmonary:     Effort: Pulmonary effort is normal. No respiratory distress.     Breath sounds: No wheezing or rales.  Chest:     Chest wall: No tenderness.  Abdominal:     General: Bowel sounds are normal.     Palpations: Abdomen is soft.  Musculoskeletal:        General: Normal range of motion.     Cervical back: Normal range of motion and neck supple.  Lymphadenopathy:     Cervical: No cervical adenopathy.  Skin:    General: Skin is warm and dry.  Neurological:     Mental Status: She is alert and oriented to person, place, and time.     Cranial Nerves: No cranial nerve deficit.  Psychiatric:        Behavior: Behavior normal.        Thought Content: Thought content normal.        Judgment: Judgment normal.        Assessment/Plan: 1. Excessive menstruation with irregular cycle Improving since procedure and IUD placement, followed by  OBGYN  2. Iron deficiency anemia due to chronic blood loss Improving, followed by hematology  3. Major depressive disorder, recurrent episode, moderate (HCC) Stable, continue prozac as before   General Counseling: Vernel verbalizes understanding of the findings of todays visit and agrees with plan of treatment. I have discussed any further diagnostic evaluation that may be needed or ordered today. We also reviewed her medications today. she has been encouraged to call the office with any  questions or concerns that should arise related to todays visit.    No orders of the defined types were placed in this encounter.   Meds ordered this encounter  Medications   omeprazole (PRILOSEC) 40 MG capsule    Sig: Take 1 capsule (40 mg total) by mouth daily.    Dispense:  90 capsule    Refill:  1    This patient was seen by Lynn Ito, PA-C in collaboration with Dr. Beverely Risen as a part of collaborative care agreement.   Total time spent:30 Minutes Time spent includes review of chart, medications, test results, and follow up plan with the patient.      Dr Lyndon Code Internal medicine

## 2022-03-31 ENCOUNTER — Other Ambulatory Visit: Payer: Self-pay | Admitting: *Deleted

## 2022-03-31 DIAGNOSIS — D500 Iron deficiency anemia secondary to blood loss (chronic): Secondary | ICD-10-CM

## 2022-04-01 ENCOUNTER — Inpatient Hospital Stay: Payer: BC Managed Care – PPO | Attending: Oncology

## 2022-04-01 DIAGNOSIS — D509 Iron deficiency anemia, unspecified: Secondary | ICD-10-CM | POA: Diagnosis not present

## 2022-04-01 DIAGNOSIS — D500 Iron deficiency anemia secondary to blood loss (chronic): Secondary | ICD-10-CM

## 2022-04-01 LAB — IRON AND TIBC
Iron: 43 ug/dL (ref 28–170)
Saturation Ratios: 13 % (ref 10.4–31.8)
TIBC: 329 ug/dL (ref 250–450)
UIBC: 286 ug/dL

## 2022-04-01 LAB — CBC WITH DIFFERENTIAL/PLATELET
Abs Immature Granulocytes: 0.02 10*3/uL (ref 0.00–0.07)
Basophils Absolute: 0.1 10*3/uL (ref 0.0–0.1)
Basophils Relative: 1 %
Eosinophils Absolute: 0.1 10*3/uL (ref 0.0–0.5)
Eosinophils Relative: 2 %
HCT: 37.8 % (ref 36.0–46.0)
Hemoglobin: 11.7 g/dL — ABNORMAL LOW (ref 12.0–15.0)
Immature Granulocytes: 0 %
Lymphocytes Relative: 28 %
Lymphs Abs: 2.2 10*3/uL (ref 0.7–4.0)
MCH: 23.1 pg — ABNORMAL LOW (ref 26.0–34.0)
MCHC: 31 g/dL (ref 30.0–36.0)
MCV: 74.7 fL — ABNORMAL LOW (ref 80.0–100.0)
Monocytes Absolute: 0.5 10*3/uL (ref 0.1–1.0)
Monocytes Relative: 6 %
Neutro Abs: 5 10*3/uL (ref 1.7–7.7)
Neutrophils Relative %: 63 %
Platelets: 302 10*3/uL (ref 150–400)
RBC: 5.06 MIL/uL (ref 3.87–5.11)
RDW: 17.4 % — ABNORMAL HIGH (ref 11.5–15.5)
WBC: 7.8 10*3/uL (ref 4.0–10.5)
nRBC: 0 % (ref 0.0–0.2)

## 2022-04-01 LAB — FERRITIN: Ferritin: 51 ng/mL (ref 11–307)

## 2022-04-01 MED FILL — Iron Sucrose Inj 20 MG/ML (Fe Equiv): INTRAVENOUS | Qty: 10 | Status: AC

## 2022-04-02 ENCOUNTER — Encounter: Payer: Self-pay | Admitting: Oncology

## 2022-04-02 ENCOUNTER — Inpatient Hospital Stay: Payer: BC Managed Care – PPO | Attending: Oncology | Admitting: Oncology

## 2022-04-02 ENCOUNTER — Inpatient Hospital Stay: Payer: BC Managed Care – PPO

## 2022-04-02 VITALS — BP 128/75 | HR 84 | Temp 97.9°F | Resp 18 | Ht 64.0 in | Wt 258.0 lb

## 2022-04-02 DIAGNOSIS — D500 Iron deficiency anemia secondary to blood loss (chronic): Secondary | ICD-10-CM

## 2022-04-02 DIAGNOSIS — D509 Iron deficiency anemia, unspecified: Secondary | ICD-10-CM | POA: Insufficient documentation

## 2022-04-02 NOTE — Progress Notes (Signed)
[place]  [phone] [phone]  ID: Rebecca Simmons OB: January 15, 1991  MR#: [id number]  XBD#:[id number]  Patient Care Team: Carolynne Edouard as PCP - General (Physician Assistant)  CHIEF COMPLAINT: Iron deficiency anemia.  INTERVAL HISTORY: Patient returns to clinic today for repeat laboratory work, further evaluation, and consideration of additional IV Venofer.  She continues to have increased weakness and fatigue, but admits this has improved since receiving IV iron.  She recently had IUD placed with significant improvement of her heavy menses. She has no neurologic complaints.  She denies any recent fevers or illnesses.  She has a good appetite and denies weight loss.  She has no chest pain, shortness of breath, cough, or hemoptysis.  She denies any nausea, vomiting, constipation, or diarrhea.  She has no urinary complaints.  Patient offers no further specific complaints today.  REVIEW OF SYSTEMS:   Review of Systems  Constitutional:  Positive for malaise/fatigue. Negative for fever and weight loss.  Respiratory: Negative.  Negative for cough and hemoptysis.   Cardiovascular: Negative.  Negative for chest pain and leg swelling.  Gastrointestinal: Negative.  Negative for abdominal pain, blood in stool and melena.  Genitourinary: Negative.  Negative for hematuria.  Musculoskeletal: Negative.  Negative for back pain.  Skin: Negative.  Negative for rash.  Neurological:  Positive for weakness. Negative for dizziness, focal weakness and headaches.  Psychiatric/Behavioral: Negative.  The patient is not nervous/anxious.     As per HPI. Otherwise, a complete review of systems is negative.  PAST MEDICAL HISTORY: Past Medical History:  Diagnosis Date   Anxiety    Asthma    Depression    GERD (gastroesophageal reflux disease)     PAST SURGICAL HISTORY: Past Surgical History:  Procedure Laterality Date   FACIAL RECONSTRUCTION SURGERY      @32  years of age   TONSILLECTOMY      FAMILY HISTORY: Family History  Problem Relation Age of Onset   Rheum arthritis Mother    Cancer Maternal Uncle    Seizures Maternal Uncle    Breast cancer Maternal Grandmother    Diabetes Maternal Grandmother    Stroke Maternal Grandmother    Cancer Paternal Grandfather     ADVANCED DIRECTIVES (Y/N):  N  HEALTH MAINTENANCE: Social History   Tobacco Use   Smoking status: Never   Smokeless tobacco: Never  Vaping Use   Vaping Use: Never used  Substance Use Topics   Alcohol use: Yes    Comment: socially   Drug use: No     Colonoscopy:  PAP:  Bone density:  Lipid panel:  No Known Allergies  Current Outpatient Medications  Medication Sig Dispense Refill   albuterol (PROAIR HFA) 108 (90 Base) MCG/ACT inhaler Inhale 1 to 2 puff by po 4 times daily as needed for wheezing/cough 1 each 3   ferrous sulfate 325 (65 FE) MG EC tablet Take 325 mg by mouth daily.     FLUoxetine (PROZAC) 40 MG capsule TAKE 1 CAPSULE(40 MG) BY MOUTH DAILY 90 capsule 3   Multiple Vitamins-Minerals (MULTIVITAMIN ADULT) CHEW Chew by mouth daily.     omeprazole (PRILOSEC) 40 MG capsule Take 1 capsule (40 mg total) by mouth daily. 90 capsule 1   traMADol (ULTRAM) 50 MG tablet Take 1 tablet (50 mg total) by mouth every 6 (six) hours as needed. 30 tablet 1   medroxyPROGESTERone (PROVERA) 10 MG tablet Take 1 tablet (10 mg total) by mouth daily. Use for ten days (Patient  not taking: Reported on 04/02/2022) 10 tablet 2   No current facility-administered medications for this visit.    OBJECTIVE: Vitals:   04/02/22 1427  BP: 128/75  Pulse: 84  Resp: 18  Temp: 97.9 F (36.6 C)  SpO2: 97%     Body mass index is 44.29 kg/m.    ECOG FS:0 - Asymptomatic  General: Well-developed, well-nourished, no acute distress. Eyes: Pink conjunctiva, anicteric sclera. HEENT: Normocephalic, moist mucous membranes. Lungs: No audible wheezing or coughing. Heart: Regular rate and  rhythm. Abdomen: Soft, nontender, no obvious distention. Musculoskeletal: No edema, cyanosis, or clubbing. Neuro: Alert, answering all questions appropriately. Cranial nerves grossly intact. Skin: No rashes or petechiae noted. Psych: Normal affect.  LAB RESULTS:  Lab Results  Component Value Date   NA 138 01/06/2022   K 4.0 01/06/2022   CL 104 01/06/2022   CO2 28 01/06/2022   GLUCOSE 100 (H) 01/06/2022   BUN 9 01/06/2022   CREATININE 0.86 01/06/2022   CALCIUM 9.1 01/06/2022   PROT 7.5 01/06/2022   ALBUMIN 3.9 01/06/2022   AST 29 01/06/2022   ALT 19 01/06/2022   ALKPHOS 62 01/06/2022   BILITOT 0.4 01/06/2022   GFRNONAA >60 01/06/2022   GFRAA >60 01/24/2018    Lab Results  Component Value Date   WBC 7.8 04/01/2022   NEUTROABS 5.0 04/01/2022   HGB 11.7 (L) 04/01/2022   HCT 37.8 04/01/2022   MCV 74.7 (L) 04/01/2022   PLT 302 04/01/2022   Lab Results  Component Value Date   IRON 43 04/01/2022   TIBC 329 04/01/2022   IRONPCTSAT 13 04/01/2022   Lab Results  Component Value Date   FERRITIN 51 04/01/2022     STUDIES: No results found.  ASSESSMENT: Iron deficiency anemia.  PLAN:    Iron deficiency anemia: Hemoglobin has significantly improved to 11.7 and her iron stores are now within normal limits.  She does not require additional IV Venofer at this time.  She last received treatment on January 16, 2022.  Patient has been instructed to continue oral iron supplementation.  Return to clinic in 4 months with repeat laboratory work, further evaluation, and consideration of additional treatment if needed.  Heavy menses: Improved with IUD placement.  Continue follow-up with OB/GYN as scheduled.  I spent a total of 20 minutes reviewing chart data, face-to-face evaluation with the patient, counseling and coordination of care as detailed above.    Patient expressed understanding and was in agreement with this plan. She also understands that She can call clinic at any  time with any questions, concerns, or complaints.    Lloyd Huger, MD   04/02/2022 4:14 PM

## 2022-04-02 NOTE — Progress Notes (Signed)
Patient states that she is still tired.  Patient is taking iron supplement that the Ob\gyn prescribed.

## 2022-06-11 ENCOUNTER — Encounter: Payer: Self-pay | Admitting: Oncology

## 2022-06-18 ENCOUNTER — Ambulatory Visit: Payer: Self-pay | Admitting: Physician Assistant

## 2022-07-29 ENCOUNTER — Encounter: Payer: Self-pay | Admitting: Oncology

## 2022-08-05 ENCOUNTER — Inpatient Hospital Stay: Payer: Self-pay | Attending: Oncology

## 2022-08-05 DIAGNOSIS — D509 Iron deficiency anemia, unspecified: Secondary | ICD-10-CM | POA: Insufficient documentation

## 2022-08-05 DIAGNOSIS — Z79899 Other long term (current) drug therapy: Secondary | ICD-10-CM | POA: Insufficient documentation

## 2022-08-05 DIAGNOSIS — Z793 Long term (current) use of hormonal contraceptives: Secondary | ICD-10-CM | POA: Insufficient documentation

## 2022-08-05 DIAGNOSIS — Z803 Family history of malignant neoplasm of breast: Secondary | ICD-10-CM | POA: Insufficient documentation

## 2022-08-05 DIAGNOSIS — N920 Excessive and frequent menstruation with regular cycle: Secondary | ICD-10-CM | POA: Insufficient documentation

## 2022-08-06 MED FILL — Iron Sucrose Inj 20 MG/ML (Fe Equiv): INTRAVENOUS | Qty: 10 | Status: AC

## 2022-08-07 ENCOUNTER — Inpatient Hospital Stay (HOSPITAL_BASED_OUTPATIENT_CLINIC_OR_DEPARTMENT_OTHER): Payer: Self-pay | Admitting: Oncology

## 2022-08-07 ENCOUNTER — Inpatient Hospital Stay: Payer: Self-pay

## 2022-08-07 ENCOUNTER — Encounter: Payer: Self-pay | Admitting: Oncology

## 2022-08-07 VITALS — BP 119/69 | HR 73 | Temp 97.6°F | Resp 17 | Wt 261.0 lb

## 2022-08-07 DIAGNOSIS — D500 Iron deficiency anemia secondary to blood loss (chronic): Secondary | ICD-10-CM

## 2022-08-07 LAB — CBC WITH DIFFERENTIAL/PLATELET
Abs Immature Granulocytes: 0.02 10*3/uL (ref 0.00–0.07)
Basophils Absolute: 0 10*3/uL (ref 0.0–0.1)
Basophils Relative: 1 %
Eosinophils Absolute: 0.1 10*3/uL (ref 0.0–0.5)
Eosinophils Relative: 1 %
HCT: 39.1 % (ref 36.0–46.0)
Hemoglobin: 12.3 g/dL (ref 12.0–15.0)
Immature Granulocytes: 0 %
Lymphocytes Relative: 24 %
Lymphs Abs: 1.7 10*3/uL (ref 0.7–4.0)
MCH: 24.3 pg — ABNORMAL LOW (ref 26.0–34.0)
MCHC: 31.5 g/dL (ref 30.0–36.0)
MCV: 77.1 fL — ABNORMAL LOW (ref 80.0–100.0)
Monocytes Absolute: 0.5 10*3/uL (ref 0.1–1.0)
Monocytes Relative: 7 %
Neutro Abs: 4.7 10*3/uL (ref 1.7–7.7)
Neutrophils Relative %: 67 %
Platelets: 296 10*3/uL (ref 150–400)
RBC: 5.07 MIL/uL (ref 3.87–5.11)
RDW: 16.5 % — ABNORMAL HIGH (ref 11.5–15.5)
WBC: 7.1 10*3/uL (ref 4.0–10.5)
nRBC: 0 % (ref 0.0–0.2)

## 2022-08-07 LAB — IRON AND TIBC
Iron: 41 ug/dL (ref 28–170)
Saturation Ratios: 13 % (ref 10.4–31.8)
TIBC: 323 ug/dL (ref 250–450)
UIBC: 282 ug/dL

## 2022-08-07 LAB — FERRITIN: Ferritin: 38 ng/mL (ref 11–307)

## 2022-08-07 NOTE — Progress Notes (Signed)
Patient here for oncology follow-up appointment, concerns of constipation °  °

## 2022-08-07 NOTE — Progress Notes (Signed)
[facility]  Telephone:([phone] [phone]  ID: Rebecca Simmons OB: 05-Oct-1990  MR#: [id number]  AOZ#:[id number]  Patient Care Team: Alan Ripper as PCP - General (Physician Assistant)  CHIEF COMPLAINT: Iron deficiency anemia.  INTERVAL HISTORY: Patient returns to clinic today for repeat laboratory work, further evaluation, and consideration of IV Venofer.  She continues to feel well and remains asymptomatic.  She does not complain of any weakness or fatigue today. She has no neurologic complaints.  She denies any recent fevers or illnesses.  She has a good appetite and denies weight loss.  She has no chest pain, shortness of breath, cough, or hemoptysis.  She denies any nausea, vomiting, constipation, or diarrhea.  She has no urinary complaints.  Patient offers no specific complaints today.  REVIEW OF SYSTEMS:   Review of Systems  Constitutional: Negative.  Negative for fever, malaise/fatigue and weight loss.  Respiratory: Negative.  Negative for cough and hemoptysis.   Cardiovascular: Negative.  Negative for chest pain and leg swelling.  Gastrointestinal: Negative.  Negative for abdominal pain, blood in stool and melena.  Genitourinary: Negative.  Negative for hematuria.  Musculoskeletal: Negative.  Negative for back pain.  Skin: Negative.  Negative for rash.  Neurological: Negative.  Negative for dizziness, focal weakness, weakness and headaches.  Psychiatric/Behavioral: Negative.  The patient is not nervous/anxious.     As per HPI. Otherwise, a complete review of systems is negative.  PAST MEDICAL HISTORY: Past Medical History:  Diagnosis Date   Anxiety    Asthma    Depression    GERD (gastroesophageal reflux disease)     PAST SURGICAL HISTORY: Past Surgical History:  Procedure Laterality Date   FACIAL RECONSTRUCTION SURGERY     @32  years of age   TONSILLECTOMY      FAMILY HISTORY: Family History  Problem Relation Age of  Onset   Rheum arthritis Mother    Cancer Maternal Uncle    Seizures Maternal Uncle    Breast cancer Maternal Grandmother    Diabetes Maternal Grandmother    Stroke Maternal Grandmother    Cancer Paternal Grandfather     ADVANCED DIRECTIVES (Y/N):  N  HEALTH MAINTENANCE: Social History   Tobacco Use   Smoking status: Never   Smokeless tobacco: Never  Vaping Use   Vaping Use: Never used  Substance Use Topics   Alcohol use: Yes    Comment: socially   Drug use: No     Colonoscopy:  PAP:  Bone density:  Lipid panel:  No Known Allergies  Current Outpatient Medications  Medication Sig Dispense Refill   albuterol (PROAIR HFA) 108 (90 Base) MCG/ACT inhaler Inhale 1 to 2 puff by po 4 times daily as needed for wheezing/cough 1 each 3   ferrous sulfate 325 (65 FE) MG EC tablet Take 325 mg by mouth daily.     FLUoxetine (PROZAC) 40 MG capsule TAKE 1 CAPSULE(40 MG) BY MOUTH DAILY 90 capsule 3   ibuprofen (ADVIL) 600 MG tablet Take 600 mg by mouth every 6 (six) hours as needed.     Multiple Vitamins-Minerals (MULTIVITAMIN ADULT) CHEW Chew by mouth daily.     omeprazole (PRILOSEC) 40 MG capsule Take 1 capsule (40 mg total) by mouth daily. 90 capsule 1   traMADol (ULTRAM) 50 MG tablet Take 1 tablet (50 mg total) by mouth every 6 (six) hours as needed. 30 tablet 1   medroxyPROGESTERone (PROVERA) 10 MG tablet Take 1 tablet (10 mg total) by mouth daily.  Use for ten days (Patient not taking: Reported on 04/02/2022) 10 tablet 2   No current facility-administered medications for this visit.    OBJECTIVE: Vitals:   08/07/22 1132  BP: 119/69  Pulse: 73  Resp: 17  Temp: 97.6 F (36.4 C)  SpO2: 98%     Body mass index is 44.8 kg/m.    ECOG FS:0 - Asymptomatic  General: Well-developed, well-nourished, no acute distress. Eyes: Pink conjunctiva, anicteric sclera. HEENT: Normocephalic, moist mucous membranes. Lungs: No audible wheezing or coughing. Heart: Regular rate and  rhythm. Abdomen: Soft, nontender, no obvious distention. Musculoskeletal: No edema, cyanosis, or clubbing. Neuro: Alert, answering all questions appropriately. Cranial nerves grossly intact. Skin: No rashes or petechiae noted. Psych: Normal affect.  LAB RESULTS:  Lab Results  Component Value Date   NA 138 01/06/2022   K 4.0 01/06/2022   CL 104 01/06/2022   CO2 28 01/06/2022   GLUCOSE 100 (H) 01/06/2022   BUN 9 01/06/2022   CREATININE 0.86 01/06/2022   CALCIUM 9.1 01/06/2022   PROT 7.5 01/06/2022   ALBUMIN 3.9 01/06/2022   AST 29 01/06/2022   ALT 19 01/06/2022   ALKPHOS 62 01/06/2022   BILITOT 0.4 01/06/2022   GFRNONAA >60 01/06/2022   GFRAA >60 01/24/2018    Lab Results  Component Value Date   WBC 7.1 08/07/2022   NEUTROABS 4.7 08/07/2022   HGB 12.3 08/07/2022   HCT 39.1 08/07/2022   MCV 77.1 (L) 08/07/2022   PLT 296 08/07/2022   Lab Results  Component Value Date   IRON 43 04/01/2022   TIBC 329 04/01/2022   IRONPCTSAT 13 04/01/2022   Lab Results  Component Value Date   FERRITIN 51 04/01/2022     STUDIES: No results found.  ASSESSMENT: Iron deficiency anemia.  PLAN:    Iron deficiency anemia: Patient's hemoglobin is within normal limits.  Iron stores are pending at time of dictation, but previously they were also within normal limits.  She is also asymptomatic.  Patient does not require additional IV Venofer today.  She last received treatment on January 16, 2022.  Patient has been instructed to continue oral iron supplementation.  Return to clinic in 6 months with repeat laboratory work, further evaluation, and consideration of additional treatment if needed.  If patient's laboratory work remains within normal limits at that time, can consider discharging from clinic.   Heavy menses: Significantly improved with IUD placement.  Continue follow-up with OB/GYN as scheduled.  I spent a total of 20 minutes reviewing chart data, face-to-face evaluation with the  patient, counseling and coordination of care as detailed above.     Patient expressed understanding and was in agreement with this plan. She also understands that She can call clinic at any time with any questions, concerns, or complaints.    Jeralyn Ruths, MD   08/07/2022 12:38 PM

## 2022-11-17 ENCOUNTER — Other Ambulatory Visit: Payer: Self-pay | Admitting: Physician Assistant

## 2022-11-17 DIAGNOSIS — F411 Generalized anxiety disorder: Secondary | ICD-10-CM

## 2022-11-17 DIAGNOSIS — F332 Major depressive disorder, recurrent severe without psychotic features: Secondary | ICD-10-CM

## 2022-11-17 NOTE — Telephone Encounter (Signed)
Pt needs to schedule appt for future refills.

## 2023-01-13 ENCOUNTER — Other Ambulatory Visit: Payer: Self-pay | Admitting: Physician Assistant

## 2023-01-20 ENCOUNTER — Other Ambulatory Visit: Payer: Self-pay | Admitting: Physician Assistant

## 2023-01-20 ENCOUNTER — Other Ambulatory Visit: Payer: Self-pay

## 2023-01-20 MED ORDER — OMEPRAZOLE 40 MG PO CPDR: 40.0000 mg | DELAYED_RELEASE_CAPSULE | Freq: Every day | ORAL | 0 refills | Status: DC

## 2023-01-20 NOTE — Telephone Encounter (Signed)
Pt called that she don't have insurance need refills for omeprazole send her 30 days advised she need appt for further refills

## 2023-02-02 ENCOUNTER — Other Ambulatory Visit: Payer: Self-pay | Admitting: *Deleted

## 2023-02-02 DIAGNOSIS — D500 Iron deficiency anemia secondary to blood loss (chronic): Secondary | ICD-10-CM

## 2023-02-03 ENCOUNTER — Inpatient Hospital Stay: Payer: Self-pay | Attending: Oncology

## 2023-02-03 DIAGNOSIS — Z79899 Other long term (current) drug therapy: Secondary | ICD-10-CM | POA: Insufficient documentation

## 2023-02-03 DIAGNOSIS — D509 Iron deficiency anemia, unspecified: Secondary | ICD-10-CM | POA: Diagnosis not present

## 2023-02-03 DIAGNOSIS — Z793 Long term (current) use of hormonal contraceptives: Secondary | ICD-10-CM | POA: Diagnosis not present

## 2023-02-03 DIAGNOSIS — Z833 Family history of diabetes mellitus: Secondary | ICD-10-CM | POA: Insufficient documentation

## 2023-02-03 DIAGNOSIS — Z803 Family history of malignant neoplasm of breast: Secondary | ICD-10-CM | POA: Insufficient documentation

## 2023-02-03 DIAGNOSIS — K219 Gastro-esophageal reflux disease without esophagitis: Secondary | ICD-10-CM | POA: Diagnosis not present

## 2023-02-03 DIAGNOSIS — D500 Iron deficiency anemia secondary to blood loss (chronic): Secondary | ICD-10-CM

## 2023-02-03 DIAGNOSIS — Z9089 Acquired absence of other organs: Secondary | ICD-10-CM | POA: Insufficient documentation

## 2023-02-03 DIAGNOSIS — Z809 Family history of malignant neoplasm, unspecified: Secondary | ICD-10-CM | POA: Diagnosis not present

## 2023-02-03 DIAGNOSIS — N920 Excessive and frequent menstruation with regular cycle: Secondary | ICD-10-CM | POA: Diagnosis not present

## 2023-02-03 DIAGNOSIS — Z823 Family history of stroke: Secondary | ICD-10-CM | POA: Diagnosis not present

## 2023-02-03 DIAGNOSIS — Z820 Family history of epilepsy and other diseases of the nervous system: Secondary | ICD-10-CM | POA: Insufficient documentation

## 2023-02-03 DIAGNOSIS — Z8261 Family history of arthritis: Secondary | ICD-10-CM | POA: Diagnosis not present

## 2023-02-03 LAB — CBC WITH DIFFERENTIAL/PLATELET
Abs Immature Granulocytes: 0.01 10*3/uL (ref 0.00–0.07)
Basophils Absolute: 0.1 10*3/uL (ref 0.0–0.1)
Basophils Relative: 1 %
Eosinophils Absolute: 0.2 10*3/uL (ref 0.0–0.5)
Eosinophils Relative: 3 %
HCT: 38.5 % (ref 36.0–46.0)
Hemoglobin: 12.3 g/dL (ref 12.0–15.0)
Immature Granulocytes: 0 %
Lymphocytes Relative: 31 %
Lymphs Abs: 2.1 10*3/uL (ref 0.7–4.0)
MCH: 25.2 pg — ABNORMAL LOW (ref 26.0–34.0)
MCHC: 31.9 g/dL (ref 30.0–36.0)
MCV: 78.9 fL — ABNORMAL LOW (ref 80.0–100.0)
Monocytes Absolute: 0.5 10*3/uL (ref 0.1–1.0)
Monocytes Relative: 7 %
Neutro Abs: 4.1 10*3/uL (ref 1.7–7.7)
Neutrophils Relative %: 58 %
Platelets: 270 10*3/uL (ref 150–400)
RBC: 4.88 MIL/uL (ref 3.87–5.11)
RDW: 14.9 % (ref 11.5–15.5)
WBC: 6.9 10*3/uL (ref 4.0–10.5)
nRBC: 0 % (ref 0.0–0.2)

## 2023-02-03 LAB — IRON AND TIBC
Iron: 72 ug/dL (ref 28–170)
Saturation Ratios: 22 % (ref 10.4–31.8)
TIBC: 329 ug/dL (ref 250–450)
UIBC: 257 ug/dL

## 2023-02-03 LAB — FERRITIN: Ferritin: 64 ng/mL (ref 11–307)

## 2023-02-04 ENCOUNTER — Inpatient Hospital Stay: Payer: Self-pay

## 2023-02-04 ENCOUNTER — Inpatient Hospital Stay: Payer: Self-pay | Admitting: Oncology

## 2023-02-05 ENCOUNTER — Inpatient Hospital Stay: Payer: Self-pay

## 2023-02-05 ENCOUNTER — Inpatient Hospital Stay (HOSPITAL_BASED_OUTPATIENT_CLINIC_OR_DEPARTMENT_OTHER): Payer: Self-pay | Admitting: Oncology

## 2023-02-05 ENCOUNTER — Encounter: Payer: Self-pay | Admitting: Oncology

## 2023-02-05 VITALS — BP 116/84 | HR 74 | Temp 97.9°F | Resp 18 | Ht 64.0 in | Wt 253.0 lb

## 2023-02-05 DIAGNOSIS — Z823 Family history of stroke: Secondary | ICD-10-CM | POA: Diagnosis not present

## 2023-02-05 DIAGNOSIS — Z809 Family history of malignant neoplasm, unspecified: Secondary | ICD-10-CM | POA: Diagnosis not present

## 2023-02-05 DIAGNOSIS — K219 Gastro-esophageal reflux disease without esophagitis: Secondary | ICD-10-CM | POA: Diagnosis not present

## 2023-02-05 DIAGNOSIS — Z9089 Acquired absence of other organs: Secondary | ICD-10-CM | POA: Diagnosis not present

## 2023-02-05 DIAGNOSIS — Z79899 Other long term (current) drug therapy: Secondary | ICD-10-CM | POA: Diagnosis not present

## 2023-02-05 DIAGNOSIS — Z820 Family history of epilepsy and other diseases of the nervous system: Secondary | ICD-10-CM | POA: Diagnosis not present

## 2023-02-05 DIAGNOSIS — Z803 Family history of malignant neoplasm of breast: Secondary | ICD-10-CM | POA: Diagnosis not present

## 2023-02-05 DIAGNOSIS — D500 Iron deficiency anemia secondary to blood loss (chronic): Secondary | ICD-10-CM

## 2023-02-05 DIAGNOSIS — Z833 Family history of diabetes mellitus: Secondary | ICD-10-CM | POA: Diagnosis not present

## 2023-02-05 DIAGNOSIS — Z793 Long term (current) use of hormonal contraceptives: Secondary | ICD-10-CM | POA: Diagnosis not present

## 2023-02-05 DIAGNOSIS — D509 Iron deficiency anemia, unspecified: Secondary | ICD-10-CM | POA: Diagnosis not present

## 2023-02-05 DIAGNOSIS — Z8261 Family history of arthritis: Secondary | ICD-10-CM | POA: Diagnosis not present

## 2023-02-05 DIAGNOSIS — N920 Excessive and frequent menstruation with regular cycle: Secondary | ICD-10-CM | POA: Diagnosis not present

## 2023-02-05 NOTE — Progress Notes (Signed)
[facility]  Telephone:(336) [phone] Fax:([phone]) [phone]  ID: Rebecca Simmons OB: 12-08-1990  MR#: [id number]  HQI#:[id number]  Patient Care Team: Alan Ripper as PCP - General (Physician Assistant)  CHIEF COMPLAINT: Iron deficiency anemia.  INTERVAL HISTORY: Patient returns to clinic today for repeat laboratory work, further evaluation, and consideration of additional IV Venofer.  She continues to feel well and remains asymptomatic.  She does not complain of any weakness or fatigue today. She has no neurologic complaints.  She denies any recent fevers or illnesses.  She has a good appetite and denies weight loss.  She has no chest pain, shortness of breath, cough, or hemoptysis.  She denies any nausea, vomiting, constipation, or diarrhea.  She has no urinary complaints.  Patient offers no specific complaints today.  REVIEW OF SYSTEMS:   Review of Systems  Constitutional: Negative.  Negative for fever, malaise/fatigue and weight loss.  Respiratory: Negative.  Negative for cough and hemoptysis.   Cardiovascular: Negative.  Negative for chest pain and leg swelling.  Gastrointestinal: Negative.  Negative for abdominal pain, blood in stool and melena.  Genitourinary: Negative.  Negative for hematuria.  Musculoskeletal: Negative.  Negative for back pain.  Skin: Negative.  Negative for rash.  Neurological: Negative.  Negative for dizziness, focal weakness, weakness and headaches.  Psychiatric/Behavioral: Negative.  The patient is not nervous/anxious.     As per HPI. Otherwise, a complete review of systems is negative.  PAST MEDICAL HISTORY: Past Medical History:  Diagnosis Date   Anxiety    Asthma    Depression    GERD (gastroesophageal reflux disease)     PAST SURGICAL HISTORY: Past Surgical History:  Procedure Laterality Date   FACIAL RECONSTRUCTION SURGERY     @32  years of age   TONSILLECTOMY      FAMILY HISTORY: Family History  Problem  Relation Age of Onset   Rheum arthritis Mother    Cancer Maternal Uncle    Seizures Maternal Uncle    Breast cancer Maternal Grandmother    Diabetes Maternal Grandmother    Stroke Maternal Grandmother    Cancer Paternal Grandfather     ADVANCED DIRECTIVES (Y/N):  N  HEALTH MAINTENANCE: Social History   Tobacco Use   Smoking status: Never   Smokeless tobacco: Never  Vaping Use   Vaping status: Never Used  Substance Use Topics   Alcohol use: Yes    Comment: socially   Drug use: No     Colonoscopy:  PAP:  Bone density:  Lipid panel:  No Known Allergies  Current Outpatient Medications  Medication Sig Dispense Refill   albuterol (PROAIR HFA) 108 (90 Base) MCG/ACT inhaler Inhale 1 to 2 puff by po 4 times daily as needed for wheezing/cough 1 each 3   ferrous sulfate 325 (65 FE) MG EC tablet Take 325 mg by mouth daily.     FLUoxetine (PROZAC) 40 MG capsule TAKE 1 CAPSULE(40 MG) BY MOUTH DAILY 30 capsule 0   ibuprofen (ADVIL) 600 MG tablet Take 600 mg by mouth every 6 (six) hours as needed.     omeprazole (PRILOSEC) 40 MG capsule Take 1 capsule (40 mg total) by mouth daily. 30 capsule 0   medroxyPROGESTERone (PROVERA) 10 MG tablet Take 1 tablet (10 mg total) by mouth daily. Use for ten days (Patient not taking: Reported on 04/02/2022) 10 tablet 2   Multiple Vitamins-Minerals (MULTIVITAMIN ADULT) CHEW Chew by mouth daily. (Patient not taking: Reported on 02/05/2023)     traMADol Janean Sark)  50 MG tablet Take 1 tablet (50 mg total) by mouth every 6 (six) hours as needed. (Patient not taking: Reported on 02/05/2023) 30 tablet 1   No current facility-administered medications for this visit.    OBJECTIVE: Vitals:   02/05/23 1105  BP: 116/84  Pulse: 74  Resp: 18  Temp: 97.9 F (36.6 C)  SpO2: 100%     Body mass index is 43.43 kg/m.    ECOG FS:0 - Asymptomatic  General: Well-developed, well-nourished, no acute distress. Eyes: Pink conjunctiva, anicteric sclera. HEENT:  Normocephalic, moist mucous membranes. Lungs: No audible wheezing or coughing. Heart: Regular rate and rhythm. Abdomen: Soft, nontender, no obvious distention. Musculoskeletal: No edema, cyanosis, or clubbing. Neuro: Alert, answering all questions appropriately. Cranial nerves grossly intact. Skin: No rashes or petechiae noted. Psych: Normal affect.  LAB RESULTS:  Lab Results  Component Value Date   NA 138 01/06/2022   K 4.0 01/06/2022   CL 104 01/06/2022   CO2 28 01/06/2022   GLUCOSE 100 (H) 01/06/2022   BUN 9 01/06/2022   CREATININE 0.86 01/06/2022   CALCIUM 9.1 01/06/2022   PROT 7.5 01/06/2022   ALBUMIN 3.9 01/06/2022   AST 29 01/06/2022   ALT 19 01/06/2022   ALKPHOS 62 01/06/2022   BILITOT 0.4 01/06/2022   GFRNONAA >60 01/06/2022   GFRAA >60 01/24/2018    Lab Results  Component Value Date   WBC 6.9 02/03/2023   NEUTROABS 4.1 02/03/2023   HGB 12.3 02/03/2023   HCT 38.5 02/03/2023   MCV 78.9 (L) 02/03/2023   PLT 270 02/03/2023   Lab Results  Component Value Date   IRON 72 02/03/2023   TIBC 329 02/03/2023   IRONPCTSAT 22 02/03/2023   Lab Results  Component Value Date   FERRITIN 64 02/03/2023     STUDIES: No results found.  ASSESSMENT: Iron deficiency anemia.  PLAN:    Iron deficiency anemia: Patient's hemoglobin and iron stores continue to be within normal limits.  No intervention is needed at this time. Patient does not require additional IV Venofer today.  She last received treatment on January 16, 2022.  Patient has been instructed to continue oral iron supplementation.  After discussion with the patient, is agreed upon that no further follow-up is necessary.  Please refer patient back if there are any questions or concerns.   Heavy menses: Significantly improved with IUD placement.  Continue follow-up with OB/GYN as scheduled.  I spent a total of 20 minutes reviewing chart data, face-to-face evaluation with the patient, counseling and coordination  of care as detailed above.  Patient expressed understanding and was in agreement with this plan. She also understands that She can call clinic at any time with any questions, concerns, or complaints.    Jeralyn Ruths, MD   02/05/2023 11:32 AM

## 2023-02-15 ENCOUNTER — Telehealth: Payer: Self-pay | Admitting: Physician Assistant

## 2023-02-15 ENCOUNTER — Ambulatory Visit: Payer: Self-pay | Admitting: Physician Assistant

## 2023-02-15 ENCOUNTER — Encounter: Payer: Self-pay | Admitting: Physician Assistant

## 2023-02-15 VITALS — BP 130/82 | HR 87 | Temp 98.3°F | Resp 16 | Ht 64.0 in | Wt 277.0 lb

## 2023-02-15 DIAGNOSIS — K219 Gastro-esophageal reflux disease without esophagitis: Secondary | ICD-10-CM

## 2023-02-15 DIAGNOSIS — F332 Major depressive disorder, recurrent severe without psychotic features: Secondary | ICD-10-CM

## 2023-02-15 DIAGNOSIS — F411 Generalized anxiety disorder: Secondary | ICD-10-CM

## 2023-02-15 MED ORDER — OMEPRAZOLE 40 MG PO CPDR: 40.0000 mg | DELAYED_RELEASE_CAPSULE | Freq: Every day | ORAL | 5 refills | Status: DC

## 2023-02-15 MED ORDER — FLUOXETINE HCL 40 MG PO CAPS
40.0000 mg | ORAL_CAPSULE | Freq: Every day | ORAL | 5 refills | Status: DC
Start: 2023-02-15 — End: 2023-08-12

## 2023-02-15 NOTE — Telephone Encounter (Signed)
Patient will call back to schedule her 6 month follow up from today-Toni

## 2023-02-15 NOTE — Progress Notes (Unsigned)
[facility] [address] [place], [place] [address]  Internal MEDICINE  Office Visit Note  Patient Name: Rebecca Simmons  [id number]  [id number]  Date of Service: 02/15/2023  Chief Complaint  Patient presents with   Follow-up   Depression   Gastroesophageal Reflux    HPI  Pt is dealing with a lot currently. Recently divorced -IUD now doing better, lighter cycles -Hematology signed off on iron now, and we can monitor -will be looking into psychiatry -130/82  Current Medication: Outpatient Encounter Medications as of 02/15/2023  Medication Sig   albuterol (PROAIR HFA) 108 (90 Base) MCG/ACT inhaler Inhale 1 to 2 puff by po 4 times daily as needed for wheezing/cough   ferrous sulfate 325 (65 FE) MG EC tablet Take 325 mg by mouth daily.   FLUoxetine (PROZAC) 40 MG capsule TAKE 1 CAPSULE(40 MG) BY MOUTH DAILY   ibuprofen (ADVIL) 600 MG tablet Take 600 mg by mouth every 6 (six) hours as needed.   medroxyPROGESTERone (PROVERA) 10 MG tablet Take 1 tablet (10 mg total) by mouth daily. Use for ten days   Multiple Vitamins-Minerals (MULTIVITAMIN ADULT) CHEW Chew by mouth daily.   omeprazole (PRILOSEC) 40 MG capsule Take 1 capsule (40 mg total) by mouth daily.   traMADol (ULTRAM) 50 MG tablet Take 1 tablet (50 mg total) by mouth every 6 (six) hours as needed.   No facility-administered encounter medications on file as of 02/15/2023.    Surgical History: Past Surgical History:  Procedure Laterality Date   FACIAL RECONSTRUCTION SURGERY     @32  years of age   TONSILLECTOMY      Medical History: Past Medical History:  Diagnosis Date   Anxiety    Asthma    Depression    GERD (gastroesophageal reflux disease)     Family History: Family History  Problem Relation Age of Onset   Rheum arthritis Mother    Cancer Maternal Uncle    Seizures Maternal Uncle    Breast cancer Maternal Grandmother    Diabetes Maternal Grandmother    Stroke Maternal Grandmother     Cancer Paternal Grandfather     Social History   Socioeconomic History   Marital status: Married    Spouse name: Not on file   Number of children: Not on file   Years of education: Not on file   Highest education level: Not on file  Occupational History   Not on file  Tobacco Use   Smoking status: Never   Smokeless tobacco: Never  Vaping Use   Vaping status: Never Used  Substance and Sexual Activity   Alcohol use: Yes    Comment: socially   Drug use: No   Sexual activity: Yes    Birth control/protection: None  Other Topics Concern   Not on file  Social History Narrative   Not on file   Social Drivers of Health   Financial Resource Strain: Not on file  Food Insecurity: Not on file  Transportation Needs: Not on file  Physical Activity: Not on file  Stress: Not on file  Social Connections: Unknown (07/12/2021)   Received from [facility]   Social Network    Social Network: Not on file  Intimate Partner Violence: Unknown (06/05/2021)   Received from [facility], [facility]   HITS    Physically Hurt: Not on file    Insult or Talk Down To: Not on file    Threaten Physical Harm: Not on file    Scream  or Curse: Not on file      Review of Systems  Vital Signs: Temp 98.3 F (36.8 C)   Resp 16   Ht 5\' 4"  (1.626 m)   Wt 277 lb (125.6 kg)   BMI 47.55 kg/m    Physical Exam     Assessment/Plan:   General Counseling: Aliveah verbalizes understanding of the findings of todays visit and agrees with plan of treatment. I have discussed any further diagnostic evaluation that may be needed or ordered today. We also reviewed her medications today. she has been encouraged to call the office with any questions or concerns that should arise related to todays visit.    No orders of the defined types were placed in this encounter.   No orders of the defined types were placed in this encounter.   This patient was seen by Lynn Ito, PA-C  in collaboration with Dr. Beverely Risen as a part of collaborative care agreement.   Total time spent:*** Minutes Time spent includes review of chart, medications, test results, and follow up plan with the patient.      Dr Lyndon Code Internal medicine

## 2023-06-16 ENCOUNTER — Encounter: Payer: Self-pay | Admitting: Oncology

## 2023-08-05 DIAGNOSIS — F321 Major depressive disorder, single episode, moderate: Secondary | ICD-10-CM | POA: Diagnosis not present

## 2023-08-05 DIAGNOSIS — F411 Generalized anxiety disorder: Secondary | ICD-10-CM | POA: Diagnosis not present

## 2023-08-05 DIAGNOSIS — F902 Attention-deficit hyperactivity disorder, combined type: Secondary | ICD-10-CM | POA: Diagnosis not present

## 2023-08-11 ENCOUNTER — Encounter: Payer: Self-pay | Admitting: Oncology

## 2023-08-11 ENCOUNTER — Other Ambulatory Visit: Payer: Self-pay | Admitting: *Deleted

## 2023-08-11 DIAGNOSIS — D500 Iron deficiency anemia secondary to blood loss (chronic): Secondary | ICD-10-CM

## 2023-08-12 ENCOUNTER — Ambulatory Visit: Admitting: Physician Assistant

## 2023-08-12 ENCOUNTER — Encounter: Payer: Self-pay | Admitting: Physician Assistant

## 2023-08-12 VITALS — BP 132/88 | HR 71 | Temp 98.3°F | Resp 16 | Ht 64.0 in | Wt 264.2 lb

## 2023-08-12 DIAGNOSIS — E782 Mixed hyperlipidemia: Secondary | ICD-10-CM | POA: Diagnosis not present

## 2023-08-12 DIAGNOSIS — E559 Vitamin D deficiency, unspecified: Secondary | ICD-10-CM

## 2023-08-12 DIAGNOSIS — F411 Generalized anxiety disorder: Secondary | ICD-10-CM

## 2023-08-12 DIAGNOSIS — R5383 Other fatigue: Secondary | ICD-10-CM | POA: Diagnosis not present

## 2023-08-12 DIAGNOSIS — R7989 Other specified abnormal findings of blood chemistry: Secondary | ICD-10-CM | POA: Diagnosis not present

## 2023-08-12 DIAGNOSIS — F332 Major depressive disorder, recurrent severe without psychotic features: Secondary | ICD-10-CM

## 2023-08-12 DIAGNOSIS — R42 Dizziness and giddiness: Secondary | ICD-10-CM | POA: Diagnosis not present

## 2023-08-12 DIAGNOSIS — E538 Deficiency of other specified B group vitamins: Secondary | ICD-10-CM

## 2023-08-12 DIAGNOSIS — D500 Iron deficiency anemia secondary to blood loss (chronic): Secondary | ICD-10-CM | POA: Diagnosis not present

## 2023-08-12 MED ORDER — OMEPRAZOLE 40 MG PO CPDR: 40.0000 mg | DELAYED_RELEASE_CAPSULE | Freq: Every day | ORAL | 3 refills | Status: AC

## 2023-08-12 MED ORDER — ALBUTEROL SULFATE HFA 108 (90 BASE) MCG/ACT IN AERS: INHALATION_SPRAY | RESPIRATORY_TRACT | 3 refills | Status: AC

## 2023-08-12 MED ORDER — FLUOXETINE HCL 40 MG PO CAPS: 40.0000 mg | ORAL_CAPSULE | Freq: Every day | ORAL | 5 refills | Status: AC

## 2023-08-12 NOTE — Progress Notes (Signed)
 [facility] [address] [place], [place] [address]  Internal MEDICINE  Office Visit Note  Patient Name: Rebecca Simmons  [id number]  [id number]  Date of Service: 08/12/2023  Chief Complaint  Patient presents with   Depression   Gastroesophageal Reflux   Follow-up    Light headed- fatigue, weight gain     HPI Pt is here for routine follow up with acute concerns -April 12 went to a wedding and had an asthma attack where she couldn't breathe. Did not have her inhaler and needs this refilled -for the last week had 3 episodes of lightheadedness and headache, states she felt slowed down and like everything took effort including speaking/thinking. Yesterday after a few hours was fatigued and ready to go back to sleep -has been trying to hydrate more -will check labs, but will go to ED if sudden worsening. Did contact hematology as she previously was getting iron  infusions and they ordered labs for next week but will go ahead and order with other labs now -she has been working out a few times per week but weight still going up  Current Medication: Outpatient Encounter Medications as of 08/12/2023  Medication Sig   albuterol  (PROAIR  HFA) 108 (90 Base) MCG/ACT inhaler Inhale 1 to 2 puff by po 4 times daily as needed for wheezing/cough   ferrous sulfate 325 (65 FE) MG EC tablet Take 325 mg by mouth daily.   FLUoxetine  (PROZAC ) 40 MG capsule Take 1 capsule (40 mg total) by mouth daily.   ibuprofen (ADVIL) 600 MG tablet Take 600 mg by mouth every 6 (six) hours as needed.   medroxyPROGESTERone  (PROVERA ) 10 MG tablet Take 1 tablet (10 mg total) by mouth daily. Use for ten days   Multiple Vitamins-Minerals (MULTIVITAMIN ADULT) CHEW Chew by mouth daily.   omeprazole  (PRILOSEC) 40 MG capsule Take 1 capsule (40 mg total) by mouth daily.   traMADol  (ULTRAM ) 50 MG tablet Take 1 tablet (50 mg total) by mouth every 6 (six) hours as needed.   No facility-administered encounter medications  on file as of 08/12/2023.    Surgical History: Past Surgical History:  Procedure Laterality Date   FACIAL RECONSTRUCTION SURGERY     @33  years of age   TONSILLECTOMY      Medical History: Past Medical History:  Diagnosis Date   Anxiety    Asthma    Depression    GERD (gastroesophageal reflux disease)     Family History: Family History  Problem Relation Age of Onset   Rheum arthritis Mother    Cancer Maternal Uncle    Seizures Maternal Uncle    Breast cancer Maternal Grandmother    Diabetes Maternal Grandmother    Stroke Maternal Grandmother    Cancer Paternal Grandfather     Social History   Socioeconomic History   Marital status: Married    Spouse name: Not on file   Number of children: Not on file   Years of education: Not on file   Highest education level: Not on file  Occupational History   Not on file  Tobacco Use   Smoking status: Never   Smokeless tobacco: Never  Vaping Use   Vaping status: Never Used  Substance and Sexual Activity   Alcohol use: Yes    Comment: socially   Drug use: No   Sexual activity: Yes    Birth control/protection: None  Other Topics Concern   Not on file  Social History Narrative   Not on file  Social Drivers of Corporate investment banker Strain: Not on file  Food Insecurity: Not on file  Transportation Needs: Not on file  Physical Activity: Not on file  Stress: Not on file  Social Connections: Unknown (07/12/2021)   Received from [facility]   Social Network    Social Network: Not on file  Intimate Partner Violence: Unknown (06/05/2021)   Received from [facility]   HITS    Physically Hurt: Not on file    Insult or Talk Down To: Not on file    Threaten Physical Harm: Not on file    Scream or Curse: Not on file      Review of Systems  Constitutional:  Positive for fatigue. Negative for chills and unexpected weight change.  HENT:  Negative for congestion, postnasal drip, rhinorrhea, sneezing and sore  throat.   Eyes:  Negative for redness.  Respiratory:  Negative for cough, chest tightness and shortness of breath.   Cardiovascular:  Negative for chest pain and palpitations.  Gastrointestinal:  Negative for abdominal pain, constipation, diarrhea, nausea and vomiting.  Genitourinary:  Negative for dysuria and frequency.  Musculoskeletal:  Negative for arthralgias, back pain, joint swelling and neck pain.  Skin:  Negative for rash.  Neurological:  Positive for light-headedness and headaches. Negative for tremors, syncope and numbness.  Hematological:  Negative for adenopathy. Does not bruise/bleed easily.  Psychiatric/Behavioral:  Positive for behavioral problems (Depression) and sleep disturbance. Negative for suicidal ideas. The patient is nervous/anxious.     Vital Signs: BP 132/88   Pulse 71   Temp 98.3 F (36.8 C)   Resp 16   Ht 5' 4 (1.626 m)   Wt 264 lb 3.2 oz (119.8 kg)   SpO2 97%   BMI 45.35 kg/m    Physical Exam Vitals and nursing note reviewed.  Constitutional:      General: She is not in acute distress.    Appearance: She is well-developed. She is obese. She is not diaphoretic.  HENT:     Head: Normocephalic and atraumatic.   Eyes:     Pupils: Pupils are equal, round, and reactive to light.   Neck:     Thyroid: No thyromegaly.     Vascular: No JVD.     Trachea: No tracheal deviation.   Cardiovascular:     Rate and Rhythm: Normal rate and regular rhythm.     Heart sounds: Normal heart sounds. No murmur heard.    No friction rub. No gallop.  Pulmonary:     Effort: Pulmonary effort is normal. No respiratory distress.     Breath sounds: No wheezing or rales.  Chest:     Chest wall: No tenderness.  Abdominal:     General: Bowel sounds are normal.     Palpations: Abdomen is soft.   Musculoskeletal:        General: Normal range of motion.   Skin:    General: Skin is warm and dry.   Neurological:     Mental Status: She is alert and oriented to  person, place, and time.   Psychiatric:        Behavior: Behavior normal.        Thought Content: Thought content normal.        Judgment: Judgment normal.        Assessment/Plan: 1. Lightheadedness (Primary) Will hydrate and ensure eating at regular intervals. Will check labs - Hgb A1C w/o eAG  2. Iron  deficiency anemia due to chronic blood loss  Will check labs - Fe+TIBC+Fer  3. Generalized anxiety disorder Continue prozac  as before - FLUoxetine  (PROZAC ) 40 MG capsule; Take 1 capsule (40 mg total) by mouth daily.  Dispense: 30 capsule; Refill: 5  4. Vitamin D  deficiency - VITAMIN D  25 Hydroxy (Vit-D Deficiency, Fractures)  5. B12 deficiency - B12 and Folate Panel  6. Mixed hyperlipidemia - Lipid Panel With LDL/HDL Ratio  7. Elevated TSH - TSH + free T4  8. Severe episode of recurrent major depressive disorder, without psychotic features (HCC) - FLUoxetine  (PROZAC ) 40 MG capsule; Take 1 capsule (40 mg total) by mouth daily.  Dispense: 30 capsule; Refill: 5  9. Other fatigue - CBC w/Diff/Platelet - Comprehensive metabolic panel with GFR - Fe+TIBC+Fer - Hgb A1C w/o eAG - Lipid Panel With LDL/HDL Ratio - TSH + free T4 - B12 and Folate Panel - VITAMIN D  25 Hydroxy (Vit-D Deficiency, Fractures)   General Counseling: Airica verbalizes understanding of the findings of todays visit and agrees with plan of treatment. I have discussed any further diagnostic evaluation that may be needed or ordered today. We also reviewed her medications today. she has been encouraged to call the office with any questions or concerns that should arise related to todays visit.    No orders of the defined types were placed in this encounter.   No orders of the defined types were placed in this encounter.   This patient was seen by Tinnie Pro, PA-C in collaboration with Dr. Sigrid Bathe as a part of collaborative care agreement.   Total time spent:30 Minutes Time spent  includes review of chart, medications, test results, and follow up plan with the patient.      Dr Fozia M Khan Internal medicine

## 2023-08-13 LAB — COMPREHENSIVE METABOLIC PANEL WITH GFR
ALT: 14 IU/L (ref 0–32)
AST: 14 IU/L (ref 0–40)
Albumin: 4.2 g/dL (ref 3.9–4.9)
Alkaline Phosphatase: 85 IU/L (ref 44–121)
BUN/Creatinine Ratio: 12 (ref 9–23)
BUN: 10 mg/dL (ref 6–20)
Bilirubin Total: 0.3 mg/dL (ref 0.0–1.2)
CO2: 23 mmol/L (ref 20–29)
Calcium: 9.3 mg/dL (ref 8.7–10.2)
Chloride: 99 mmol/L (ref 96–106)
Creatinine, Ser: 0.81 mg/dL (ref 0.57–1.00)
Globulin, Total: 2.6 g/dL (ref 1.5–4.5)
Glucose: 83 mg/dL (ref 70–99)
Potassium: 4.3 mmol/L (ref 3.5–5.2)
Sodium: 136 mmol/L (ref 134–144)
Total Protein: 6.8 g/dL (ref 6.0–8.5)
eGFR: 99 mL/min/{1.73_m2} (ref >59)

## 2023-08-13 LAB — CBC WITH DIFFERENTIAL/PLATELET
Basophils Absolute: 0.1 10*3/uL (ref 0.0–0.2)
Basos: 1 %
EOS (ABSOLUTE): 0.2 10*3/uL (ref 0.0–0.4)
Eos: 2 %
Hematocrit: 39.6 % (ref 34.0–46.6)
Hemoglobin: 12.5 g/dL (ref 11.1–15.9)
Immature Grans (Abs): 0 10*3/uL (ref 0.0–0.1)
Immature Granulocytes: 0 %
Lymphocytes Absolute: 2.2 10*3/uL (ref 0.7–3.1)
Lymphs: 23 %
MCH: 25.4 pg — ABNORMAL LOW (ref 26.6–33.0)
MCHC: 31.6 g/dL (ref 31.5–35.7)
MCV: 81 fL (ref 79–97)
Monocytes Absolute: 0.6 10*3/uL (ref 0.1–0.9)
Monocytes: 6 %
Neutrophils Absolute: 6.7 10*3/uL (ref 1.4–7.0)
Neutrophils: 68 %
Platelets: 322 10*3/uL (ref 150–450)
RBC: 4.92 x10E6/uL (ref 3.77–5.28)
RDW: 14.4 % (ref 11.7–15.4)
WBC: 9.8 10*3/uL (ref 3.4–10.8)

## 2023-08-13 LAB — IRON,TIBC AND FERRITIN PANEL
Ferritin: 80 ng/mL (ref 15–150)
Iron Saturation: 16 % (ref 15–55)
Iron: 44 ug/dL (ref 27–159)
Total Iron Binding Capacity: 282 ug/dL (ref 250–450)
UIBC: 238 ug/dL (ref 131–425)

## 2023-08-13 LAB — LIPID PANEL WITH LDL/HDL RATIO
Cholesterol, Total: 153 mg/dL (ref 100–199)
HDL: 52 mg/dL (ref >39)
LDL Chol Calc (NIH): 89 mg/dL (ref 0–99)
LDL/HDL Ratio: 1.7 ratio (ref 0.0–3.2)
Triglycerides: 58 mg/dL (ref 0–149)
VLDL Cholesterol Cal: 12 mg/dL (ref 5–40)

## 2023-08-13 LAB — B12 AND FOLATE PANEL
Folate: 6.2 ng/mL (ref >3.0)
Vitamin B-12: 1027 pg/mL (ref 232–1245)

## 2023-08-13 LAB — VITAMIN D 25 HYDROXY (VIT D DEFICIENCY, FRACTURES): Vit D, 25-Hydroxy: 95.8 ng/mL (ref 30.0–100.0)

## 2023-08-13 LAB — HGB A1C W/O EAG: Hgb A1c MFr Bld: 5.8 % — ABNORMAL HIGH (ref 4.8–5.6)

## 2023-08-13 LAB — TSH+FREE T4
Free T4: 1.05 ng/dL (ref 0.82–1.77)
TSH: 2.8 u[IU]/mL (ref 0.450–4.500)

## 2023-08-16 ENCOUNTER — Encounter (HOSPITAL_BASED_OUTPATIENT_CLINIC_OR_DEPARTMENT_OTHER): Payer: Self-pay

## 2023-08-16 ENCOUNTER — Emergency Department (HOSPITAL_BASED_OUTPATIENT_CLINIC_OR_DEPARTMENT_OTHER)
Admission: EM | Admit: 2023-08-16 | Discharge: 2023-08-16 | Disposition: A | Discharge status: Home / Self Care | Attending: Emergency Medicine | Admitting: Emergency Medicine

## 2023-08-16 ENCOUNTER — Other Ambulatory Visit: Payer: Self-pay

## 2023-08-16 ENCOUNTER — Emergency Department (HOSPITAL_BASED_OUTPATIENT_CLINIC_OR_DEPARTMENT_OTHER)

## 2023-08-16 ENCOUNTER — Encounter: Payer: Self-pay | Admitting: Oncology

## 2023-08-16 ENCOUNTER — Telehealth: Payer: Self-pay | Admitting: *Deleted

## 2023-08-16 DIAGNOSIS — R42 Dizziness and giddiness: Secondary | ICD-10-CM

## 2023-08-16 DIAGNOSIS — R4781 Slurred speech: Secondary | ICD-10-CM | POA: Diagnosis not present

## 2023-08-16 DIAGNOSIS — G43009 Migraine without aura, not intractable, without status migrainosus: Secondary | ICD-10-CM | POA: Diagnosis not present

## 2023-08-16 DIAGNOSIS — J45909 Unspecified asthma, uncomplicated: Secondary | ICD-10-CM | POA: Diagnosis not present

## 2023-08-16 LAB — CBC WITH DIFFERENTIAL/PLATELET
Abs Immature Granulocytes: 0.03 10*3/uL (ref 0.00–0.07)
Basophils Absolute: 0.1 10*3/uL (ref 0.0–0.1)
Basophils Relative: 1 %
Eosinophils Absolute: 0.2 10*3/uL (ref 0.0–0.5)
Eosinophils Relative: 2 %
HCT: 38.1 % (ref 36.0–46.0)
Hemoglobin: 12.4 g/dL (ref 12.0–15.0)
Immature Granulocytes: 0 %
Lymphocytes Relative: 25 %
Lymphs Abs: 1.9 10*3/uL (ref 0.7–4.0)
MCH: 25 pg — ABNORMAL LOW (ref 26.0–34.0)
MCHC: 32.5 g/dL (ref 30.0–36.0)
MCV: 76.8 fL — ABNORMAL LOW (ref 80.0–100.0)
Monocytes Absolute: 0.5 10*3/uL (ref 0.1–1.0)
Monocytes Relative: 7 %
Neutro Abs: 5.1 10*3/uL (ref 1.7–7.7)
Neutrophils Relative %: 65 %
Platelets: 304 10*3/uL (ref 150–400)
RBC: 4.96 MIL/uL (ref 3.87–5.11)
RDW: 14.6 % (ref 11.5–15.5)
WBC: 7.8 10*3/uL (ref 4.0–10.5)
nRBC: 0 % (ref 0.0–0.2)

## 2023-08-16 LAB — COMPREHENSIVE METABOLIC PANEL WITH GFR
ALT: 20 U/L (ref 0–44)
AST: 22 U/L (ref 15–41)
Albumin: 4.3 g/dL (ref 3.5–5.0)
Alkaline Phosphatase: 84 U/L (ref 38–126)
Anion gap: 13 (ref 5–15)
BUN: 8 mg/dL (ref 6–20)
CO2: 25 mmol/L (ref 22–32)
Calcium: 9.6 mg/dL (ref 8.9–10.3)
Chloride: 99 mmol/L (ref 98–111)
Creatinine, Ser: 0.87 mg/dL (ref 0.44–1.00)
GFR, Estimated: >60 mL/min (ref >60)
Glucose, Bld: 80 mg/dL (ref 70–99)
Potassium: 4.3 mmol/L (ref 3.5–5.1)
Sodium: 137 mmol/L (ref 135–145)
Total Bilirubin: 0.6 mg/dL (ref 0.0–1.2)
Total Protein: 7.5 g/dL (ref 6.5–8.1)

## 2023-08-16 MED ORDER — PROCHLORPERAZINE EDISYLATE 10 MG/2ML IJ SOLN
10.0000 mg | Freq: Once | INTRAMUSCULAR | Status: AC
  Administered 2023-08-16: 10 mg via INTRAVENOUS
  Filled 2023-08-16: qty 2

## 2023-08-16 MED ORDER — ACETAMINOPHEN 500 MG PO TABS
1000.0000 mg | ORAL_TABLET | Freq: Once | ORAL | Status: AC
  Administered 2023-08-16: 1000 mg via ORAL
  Filled 2023-08-16: qty 2

## 2023-08-16 MED ORDER — MECLIZINE HCL 25 MG PO TABS
50.0000 mg | ORAL_TABLET | Freq: Once | ORAL | Status: AC
  Administered 2023-08-16: 50 mg via ORAL
  Filled 2023-08-16: qty 2

## 2023-08-16 MED ORDER — DIPHENHYDRAMINE HCL 50 MG/ML IJ SOLN
12.5000 mg | Freq: Once | INTRAMUSCULAR | Status: AC
Start: 2023-08-16 — End: 2023-08-16
  Administered 2023-08-16: 12.5 mg via INTRAVENOUS
  Filled 2023-08-16: qty 1

## 2023-08-16 MED ORDER — LACTATED RINGERS IV BOLUS
1000.0000 mL | Freq: Once | INTRAVENOUS | Status: AC
  Administered 2023-08-16: 1000 mL via INTRAVENOUS

## 2023-08-16 NOTE — ED Notes (Signed)
 Reviewed AVS/discharge instruction with patient. Time allotted for and all questions answered. Patient is agreeable for d/c and escorted to ed exit by staff.

## 2023-08-16 NOTE — ED Notes (Signed)
 Patient transported to CT

## 2023-08-16 NOTE — Discharge Instructions (Addendum)
 Your blood counts, electrolytes, kidney, and liver labs are normal today.  Your head CT did not show any abnormalities to explain your headache or dizziness.  Please keep well-hydrated at home with water, enough to keep your urine a light yellow color.  When going from sitting to standing, please go very slowly to allow your body time to adjust.  Wearing compression socks may also help to reduce dizziness when changing positions.  You may take up to 1000mg  of tylenol  every 6 hours as needed for pain/headache.  Do not take more then 4g per day.  You may use up to 600mg  ibuprofen every 6 hours as needed for pain/headache.  Do not exceed 2.4g of ibuprofen per day.  You may use 25 mg meclizine every 12 as needed for dizziness.  This is an over-the-counter medication  Please follow-up with your PCP within the next week for further workup of your symptoms.  Return to the ER for any headache not controlled with home medications, unexplained fevers, severe dizziness, unilateral weakness, slurred speech, any other new or concerning symptoms

## 2023-08-16 NOTE — ED Notes (Signed)
 Pt stated she was dizzy throughout orthostatic vitals.

## 2023-08-16 NOTE — ED Provider Notes (Signed)
 [place] EMERGENCY DEPARTMENT AT [facility] Provider Note   CSN: [id number] Arrival date & time: 08/16/23  1530     Patient presents with: Dizziness   Rebecca Simmons is a 33 y.o. female with history of anxiety, asthma, iron  deficiency anemia, presents with concern for dizziness that has been ongoing for the past week.  States dizziness occurs when she stands up.  She will then feel lightheaded to the point she needs to sit down.  Denies hitting her head.  She denies any double vision or room spinning.  She feels like her head feels heavy and she has also been having a headache in the back left side of her head.  States she will get headaches about every month or so.  This headache is not completely abnormal for her.  She denies any chest pain or shortness of breath, fevers or neck pain.    Dizziness      Prior to Admission medications   Medication Sig Start Date End Date Taking? Authorizing Provider  albuterol  (PROAIR  HFA) 108 (90 Base) MCG/ACT inhaler Inhale 1 to 2 puff by po 4 times daily as needed for wheezing/cough 08/12/23   McDonough, Lauren K, PA-C  ferrous sulfate 325 (65 FE) MG EC tablet Take 325 mg by mouth daily.    [provider]  FLUoxetine  (PROZAC ) 40 MG capsule Take 1 capsule (40 mg total) by mouth daily. 08/12/23   McDonough, Lauren K, PA-C  ibuprofen (ADVIL) 600 MG tablet Take 600 mg by mouth every 6 (six) hours as needed. 03/03/22   [provider]  medroxyPROGESTERone  (PROVERA ) 10 MG tablet Take 1 tablet (10 mg total) by mouth daily. Use for ten days 02/06/22   Teresa Fender, MD  Multiple Vitamins-Minerals (MULTIVITAMIN ADULT) CHEW Chew by mouth daily.    [provider]  omeprazole  (PRILOSEC) 40 MG capsule Take 1 capsule (40 mg total) by mouth daily. 08/12/23   McDonough, Lauren K, PA-C    Allergies: Patient has no known allergies.    Review of Systems  Neurological:  Positive for dizziness.    Updated Vital Signs BP 116/73    Pulse 81   Temp 98.3 F (36.8 C)   Resp 16   SpO2 100%   Physical Exam Vitals and nursing note reviewed.  Constitutional:      General: She is not in acute distress.    Appearance: She is well-developed.  HENT:     Head: Normocephalic and atraumatic.   Eyes:     Extraocular Movements: Extraocular movements intact.     Conjunctiva/sclera: Conjunctivae normal.     Pupils: Pupils are equal, round, and reactive to light.   Neck:     Comments: Neck range of motion normal, no nuchal rigidity Cardiovascular:     Rate and Rhythm: Normal rate and regular rhythm.     Heart sounds: No murmur heard. Pulmonary:     Effort: Pulmonary effort is normal. No respiratory distress.     Breath sounds: Normal breath sounds.  Abdominal:     Palpations: Abdomen is soft.     Tenderness: There is no abdominal tenderness.   Musculoskeletal:        General: No swelling.     Cervical back: Normal range of motion and neck supple.   Skin:    General: Skin is warm and dry.     Capillary Refill: Capillary refill takes less than 2 seconds.   Neurological:     General: No focal deficit present.  Mental Status: She is alert and oriented to person, place, and time.     Comments: Mental status: Alert and oriented to self, place, and month  Speech: Answers questions appropriately  Cranial Nerves: III, IV, VI: EOM intact, Pupils equal round and reactive, no gaze preference or deviation, no nystagmus. V: normal sensation in V1, V2, and V3 segments bilaterally VII: smiles, puffs cheeks, raises eyebrows, and closes eyes without asymmetry.  VIII: normal hearing to speech IX, X: normal palatal elevation, no uvular deviation XI: 5/5 head turn and 5/5 shoulder shrug bilaterally XII: midline tongue protrusion  Motor: 5/5 strength with shoulder shrug, elbow flexion extension, wrist flexion extension bilaterally  5/5 strength with plantarflexion and dorsiflexion bilaterally   Sensory: Intact  sensation in upper and lower extremity bilaterally    Coordination: Normal finger to nose and heel to shin, no tremor, no dysmetria   Psychiatric:        Mood and Affect: Mood normal.     (all labs ordered are listed, but only abnormal results are displayed) Labs Reviewed  CBC WITH DIFFERENTIAL/PLATELET - Abnormal; Notable for the following components:      Result Value   MCV 76.8 (*)    MCH 25.0 (*)    All other components within normal limits  COMPREHENSIVE METABOLIC PANEL WITH GFR  HCG, SERUM, QUALITATIVE    EKG: None  Radiology: CT Head Wo Contrast Result Date: 08/16/2023 CLINICAL DATA:  Dizziness and slurred speech. EXAM: CT HEAD WITHOUT CONTRAST TECHNIQUE: Contiguous axial images were obtained from the base of the skull through the vertex without intravenous contrast. RADIATION DOSE REDUCTION: This exam was performed according to the departmental dose-optimization program which includes automated exposure control, adjustment of the mA and/or kV according to patient size and/or use of iterative reconstruction technique. COMPARISON:  None Available. FINDINGS: Brain: No evidence of acute infarction, hemorrhage, hydrocephalus, extra-axial collection or mass lesion/mass effect. Vascular: No hyperdense vessel or unexpected calcification. Skull: Normal. Negative for fracture or focal lesion. Sinuses/Orbits: No acute finding. Other: None. IMPRESSION: No acute intracranial pathology. Electronically Signed   By: Virgle Grime M.D.   On: 08/16/2023 19:05     Procedures   Medications Ordered in the ED  meclizine (ANTIVERT) tablet 50 mg (50 mg Oral Given 08/16/23 1921)  prochlorperazine (COMPAZINE) injection 10 mg (10 mg Intravenous Given 08/16/23 1922)  diphenhydrAMINE (BENADRYL) injection 12.5 mg (12.5 mg Intravenous Given 08/16/23 1921)  acetaminophen  (TYLENOL ) tablet 1,000 mg (1,000 mg Oral Given 08/16/23 1921)  lactated ringers bolus 1,000 mL (0 mLs Intravenous Stopped 08/16/23  1948)                                    Medical Decision Making Amount and/or Complexity of Data Reviewed Labs: ordered. Radiology: ordered.  Risk OTC drugs. Prescription drug management.     Differential diagnosis includes but is not limited to dehydration, electrolyte abnormality, arrhythmia, vertigo, Mnire's, orthostatic hypotension, Tension headache, migraine, trigeminal neuralgia, cluster headache, meningitis, concussion, intracranial mass, intracranial hemorrhage, carbon monoxide poisoning, sinus venous thrombosis   ED Course:  Upon initial evaluation, patient is well-appearing, no acute distress.  Blood pressure mildly elevated at 145/88, but otherwise stable vitals.  Normal neurological exam.  Labs Ordered: I Ordered, and personally interpreted labs.  The pertinent results include:   CBC unremarkable CMP without any electrolyte abnormalities.  Normal LFTs and creatinine  Imaging Studies ordered: I ordered imaging studies including  CT head I independently visualized the imaging with scope of interpretation limited to determining acute life threatening conditions related to emergency care. Imaging showed  No acute abnormalities I agree with the radiologist interpretation   Cardiac Monitoring: / EKG: The patient was maintained on a cardiac monitor.  I personally viewed and interpreted the cardiac monitored which showed an underlying rhythm of: Normal sinus rhythm  Medications Given: Meclizine, Compazine, Benadryl, Tylenol , LR for dizziness and headache  Upon re-evaluation, patient remains well-appearing with stable vitals.  Reports headache has resolved.  Also reports dizziness has resolved.  She has been able to ambulate without difficulty.  CT without any abnormality to explain headache.  She is not having any neck stiffness or fevers.  Low concern for meningitis, intracranial abnormality, or other acute cause of headache.  Suspect migraine which could have also  been contributing to the dizziness she was feeling.  Given dizziness was with changes in position, and now feeling better with meclizine, also considering vertigo etiology to her dizziness.  No neurodeficits or abnormalities on CT head to explain dizziness.  Her labs are unremarkable with normal electrolytes, no anemia.  Feel she is stable and appropriate for discharge home at this time.    Impression: Migraine, now resolved Dizziness, now resolved  Disposition:  The patient was discharged home with instructions to follow-up with PCP within the next week for recheck of symptoms and for further management.  May take meclizine at home to improve dizziness.  Stay well-hydrated.  May try compression socks and changing positions slowly to also help with dizziness. Return precautions given.    Record Review: External records from outside source obtained and reviewed including prior visits to oncology for iron  deficiency anemia     This chart was dictated using voice recognition software, Dragon. Despite the best efforts of this provider to proofread and correct errors, errors may still occur which can change documentation meaning.       Final diagnoses:  Migraine without aura and without status migrainosus, not intractable  Dizziness    ED Discharge Orders     None          Rexie Catena, PA-C 08/16/23 2107    Sallyanne Creamer, DO 08/17/23 [phone]

## 2023-08-16 NOTE — ED Triage Notes (Signed)
 Pt c/o dizziness, slurred speech onset Wednesday, 2x mechanical falls today. 6/10 throbbing HA pain, SHOB on exertion. Hx iron  deficient anemia. Advises she's needed infusions before, thinks she needs one now. Labs done 6/12, hgn 12.5

## 2023-08-16 NOTE — Telephone Encounter (Signed)
 Mother says that she is in the ER she has labs for tomorrow June 17 they will let us  know if she needs to cancel based on what happens in the ER

## 2023-08-17 ENCOUNTER — Inpatient Hospital Stay: Payer: Self-pay

## 2023-08-21 ENCOUNTER — Encounter: Payer: Self-pay | Admitting: Oncology

## 2023-08-25 DIAGNOSIS — F902 Attention-deficit hyperactivity disorder, combined type: Secondary | ICD-10-CM | POA: Diagnosis not present

## 2023-08-28 DIAGNOSIS — L732 Hidradenitis suppurativa: Secondary | ICD-10-CM | POA: Diagnosis not present

## 2023-08-28 DIAGNOSIS — L03112 Cellulitis of left axilla: Secondary | ICD-10-CM | POA: Diagnosis not present

## 2023-08-30 DIAGNOSIS — F902 Attention-deficit hyperactivity disorder, combined type: Secondary | ICD-10-CM | POA: Diagnosis not present

## 2023-08-30 DIAGNOSIS — F840 Autistic disorder: Secondary | ICD-10-CM | POA: Diagnosis not present

## 2023-08-31 DIAGNOSIS — F321 Major depressive disorder, single episode, moderate: Secondary | ICD-10-CM | POA: Diagnosis not present

## 2023-08-31 DIAGNOSIS — F39 Unspecified mood [affective] disorder: Secondary | ICD-10-CM | POA: Diagnosis not present

## 2023-08-31 DIAGNOSIS — F909 Attention-deficit hyperactivity disorder, unspecified type: Secondary | ICD-10-CM | POA: Diagnosis not present

## 2023-08-31 DIAGNOSIS — F411 Generalized anxiety disorder: Secondary | ICD-10-CM | POA: Diagnosis not present

## 2023-08-31 DIAGNOSIS — F840 Autistic disorder: Secondary | ICD-10-CM | POA: Diagnosis not present

## 2023-08-31 DIAGNOSIS — F902 Attention-deficit hyperactivity disorder, combined type: Secondary | ICD-10-CM | POA: Diagnosis not present

## 2023-09-01 ENCOUNTER — Ambulatory Visit: Payer: Self-pay | Admitting: Physician Assistant

## 2023-09-06 DIAGNOSIS — F39 Unspecified mood [affective] disorder: Secondary | ICD-10-CM | POA: Diagnosis not present

## 2023-09-06 DIAGNOSIS — F909 Attention-deficit hyperactivity disorder, unspecified type: Secondary | ICD-10-CM | POA: Diagnosis not present

## 2023-09-06 DIAGNOSIS — F840 Autistic disorder: Secondary | ICD-10-CM | POA: Diagnosis not present

## 2023-09-07 ENCOUNTER — Inpatient Hospital Stay: Admitting: Oncology

## 2023-09-07 ENCOUNTER — Inpatient Hospital Stay

## 2023-09-09 DIAGNOSIS — F411 Generalized anxiety disorder: Secondary | ICD-10-CM | POA: Diagnosis not present

## 2023-09-09 DIAGNOSIS — F902 Attention-deficit hyperactivity disorder, combined type: Secondary | ICD-10-CM | POA: Diagnosis not present

## 2023-09-09 DIAGNOSIS — F321 Major depressive disorder, single episode, moderate: Secondary | ICD-10-CM | POA: Diagnosis not present

## 2023-09-13 ENCOUNTER — Ambulatory Visit: Admitting: Physician Assistant

## 2023-09-14 ENCOUNTER — Inpatient Hospital Stay (HOSPITAL_BASED_OUTPATIENT_CLINIC_OR_DEPARTMENT_OTHER): Admitting: Oncology

## 2023-09-14 ENCOUNTER — Encounter: Payer: Self-pay | Admitting: Oncology

## 2023-09-14 ENCOUNTER — Inpatient Hospital Stay: Attending: Oncology

## 2023-09-14 VITALS — BP 119/71 | HR 78 | Temp 97.6°F | Resp 18 | Ht 64.0 in | Wt 260.0 lb

## 2023-09-14 DIAGNOSIS — Z79899 Other long term (current) drug therapy: Secondary | ICD-10-CM | POA: Insufficient documentation

## 2023-09-14 DIAGNOSIS — Z793 Long term (current) use of hormonal contraceptives: Secondary | ICD-10-CM | POA: Diagnosis not present

## 2023-09-14 DIAGNOSIS — N920 Excessive and frequent menstruation with regular cycle: Secondary | ICD-10-CM | POA: Diagnosis not present

## 2023-09-14 DIAGNOSIS — D500 Iron deficiency anemia secondary to blood loss (chronic): Secondary | ICD-10-CM

## 2023-09-14 LAB — CBC WITH DIFFERENTIAL/PLATELET
Abs Immature Granulocytes: 0.03 K/uL (ref 0.00–0.07)
Basophils Absolute: 0.1 K/uL (ref 0.0–0.1)
Basophils Relative: 1 %
Eosinophils Absolute: 0.2 K/uL (ref 0.0–0.5)
Eosinophils Relative: 3 %
HCT: 37.5 % (ref 36.0–46.0)
Hemoglobin: 12.1 g/dL (ref 12.0–15.0)
Immature Granulocytes: 0 %
Lymphocytes Relative: 26 %
Lymphs Abs: 2.1 K/uL (ref 0.7–4.0)
MCH: 24.9 pg — ABNORMAL LOW (ref 26.0–34.0)
MCHC: 32.3 g/dL (ref 30.0–36.0)
MCV: 77.2 fL — ABNORMAL LOW (ref 80.0–100.0)
Monocytes Absolute: 0.5 K/uL (ref 0.1–1.0)
Monocytes Relative: 6 %
Neutro Abs: 5.2 K/uL (ref 1.7–7.7)
Neutrophils Relative %: 64 %
Platelets: 321 K/uL (ref 150–400)
RBC: 4.86 MIL/uL (ref 3.87–5.11)
RDW: 14.3 % (ref 11.5–15.5)
WBC: 8 K/uL (ref 4.0–10.5)
nRBC: 0 % (ref 0.0–0.2)

## 2023-09-14 LAB — IRON AND TIBC
Iron: 62 ug/dL (ref 28–170)
Saturation Ratios: 20 % (ref 10.4–31.8)
TIBC: 314 ug/dL (ref 250–450)
UIBC: 252 ug/dL

## 2023-09-14 LAB — FERRITIN: Ferritin: 65 ng/mL (ref 11–307)

## 2023-09-14 NOTE — Progress Notes (Signed)
 Patient felt like the iron  infusions did help, but it feels like it is about time for another due to having fatigue again.   MCV MCH is low on some of her labs so she was wondering how would she go about getting treatment for that?

## 2023-09-14 NOTE — Progress Notes (Signed)
 [facility]  Telephone:([phone] [phone]  ID: Rebecca Simmons OB: Jul 05, 1990  MR#: [id number]  RDW#:[id number]  Patient Care Team: Kristina Tinnie MARLA DEVONNA as PCP - General (Physician Assistant) Jacobo Evalene PARAS, MD as Consulting Physician (Oncology)  CHIEF COMPLAINT: Iron  deficiency anemia.  INTERVAL HISTORY: Patient last seen in clinic in December 2024.  She returns to clinic today with complaints of increasing fatigue.  She otherwise feels well.  She has no neurologic complaints.  She denies any recent fevers or illnesses.  She has a good appetite and denies weight loss.  She has no chest pain, shortness of breath, cough, or hemoptysis.  She denies any nausea, vomiting, constipation, or diarrhea.  She has no melena or hematochezia.  She has no urinary complaints.  Patient offers no further specific complaints today.  REVIEW OF SYSTEMS:   Review of Systems  Constitutional:  Positive for malaise/fatigue. Negative for fever and weight loss.  Respiratory: Negative.  Negative for cough and hemoptysis.   Cardiovascular: Negative.  Negative for chest pain and leg swelling.  Gastrointestinal: Negative.  Negative for abdominal pain, blood in stool and melena.  Genitourinary: Negative.  Negative for hematuria.  Musculoskeletal: Negative.  Negative for back pain.  Skin: Negative.  Negative for rash.  Neurological: Negative.  Negative for dizziness, focal weakness, weakness and headaches.  Psychiatric/Behavioral: Negative.  The patient is not nervous/anxious.     As per HPI. Otherwise, a complete review of systems is negative.  PAST MEDICAL HISTORY: Past Medical History:  Diagnosis Date   Anxiety    Asthma    Depression    GERD (gastroesophageal reflux disease)     PAST SURGICAL HISTORY: Past Surgical History:  Procedure Laterality Date   FACIAL RECONSTRUCTION SURGERY     @33  years of age   TONSILLECTOMY      FAMILY HISTORY: Family History   Problem Relation Age of Onset   Rheum arthritis Mother    Cancer Maternal Uncle    Seizures Maternal Uncle    Breast cancer Maternal Grandmother    Diabetes Maternal Grandmother    Stroke Maternal Grandmother    Cancer Paternal Grandfather     ADVANCED DIRECTIVES (Y/N):  N  HEALTH MAINTENANCE: Social History   Tobacco Use   Smoking status: Never   Smokeless tobacco: Never  Vaping Use   Vaping status: Never Used  Substance Use Topics   Alcohol use: Yes    Comment: socially   Drug use: No     Colonoscopy:  PAP:  Bone density:  Lipid panel:  No Known Allergies  Current Outpatient Medications  Medication Sig Dispense Refill   albuterol  (PROAIR  HFA) 108 (90 Base) MCG/ACT inhaler Inhale 1 to 2 puff by po 4 times daily as needed for wheezing/cough 1 each 3   ferrous sulfate 325 (65 FE) MG EC tablet Take 325 mg by mouth daily.     FLUoxetine  (PROZAC ) 40 MG capsule Take 1 capsule (40 mg total) by mouth daily. 30 capsule 5   ibuprofen (ADVIL) 600 MG tablet Take 600 mg by mouth every 6 (six) hours as needed.     medroxyPROGESTERone  (PROVERA ) 10 MG tablet Take 1 tablet (10 mg total) by mouth daily. Use for ten days 10 tablet 2   methylphenidate 18 MG PO CR tablet Take 18 mg by mouth daily.     Misc Natural Products (TUMERSAID) TABS Take by mouth.     Multiple Vitamins-Minerals (MULTIVITAMIN ADULT) CHEW Chew by mouth daily.  omeprazole  (PRILOSEC) 40 MG capsule Take 1 capsule (40 mg total) by mouth daily. 90 capsule 3   No current facility-administered medications for this visit.    OBJECTIVE: Vitals:   09/14/23 1346  BP: 119/71  Pulse: 78  Resp: 18  Temp: 97.6 F (36.4 C)  SpO2: 100%     Body mass index is 44.63 kg/m.    ECOG FS:0 - Asymptomatic  General: Well-developed, well-nourished, no acute distress. Eyes: Pink conjunctiva, anicteric sclera. HEENT: Normocephalic, moist mucous membranes. Lungs: No audible wheezing or coughing. Heart: Regular rate and  rhythm. Abdomen: Soft, nontender, no obvious distention. Musculoskeletal: No edema, cyanosis, or clubbing. Neuro: Alert, answering all questions appropriately. Cranial nerves grossly intact. Skin: No rashes or petechiae noted. Psych: Normal affect.  LAB RESULTS:  Lab Results  Component Value Date   NA 137 08/16/2023   K 4.3 08/16/2023   CL 99 08/16/2023   CO2 25 08/16/2023   GLUCOSE 80 08/16/2023   BUN 8 08/16/2023   CREATININE 0.87 08/16/2023   CALCIUM 9.6 08/16/2023   PROT 7.5 08/16/2023   ALBUMIN 4.3 08/16/2023   AST 22 08/16/2023   ALT 20 08/16/2023   ALKPHOS 84 08/16/2023   BILITOT 0.6 08/16/2023   GFRNONAA >60 08/16/2023   GFRAA >60 01/24/2018    Lab Results  Component Value Date   WBC 8.0 09/14/2023   NEUTROABS 5.2 09/14/2023   HGB 12.1 09/14/2023   HCT 37.5 09/14/2023   MCV 77.2 (L) 09/14/2023   PLT 321 09/14/2023   Lab Results  Component Value Date   IRON  62 09/14/2023   TIBC 314 09/14/2023   IRONPCTSAT 20 09/14/2023   Lab Results  Component Value Date   FERRITIN 65 09/14/2023     STUDIES: CT Head Wo Contrast Result Date: 08/16/2023 CLINICAL DATA:  Dizziness and slurred speech. EXAM: CT HEAD WITHOUT CONTRAST TECHNIQUE: Contiguous axial images were obtained from the base of the skull through the vertex without intravenous contrast. RADIATION DOSE REDUCTION: This exam was performed according to the departmental dose-optimization program which includes automated exposure control, adjustment of the mA and/or kV according to patient size and/or use of iterative reconstruction technique. COMPARISON:  None Available. FINDINGS: Brain: No evidence of acute infarction, hemorrhage, hydrocephalus, extra-axial collection or mass lesion/mass effect. Vascular: No hyperdense vessel or unexpected calcification. Skull: Normal. Negative for fracture or focal lesion. Sinuses/Orbits: No acute finding. Other: None. IMPRESSION: No acute intracranial pathology. Electronically  Signed   By: Suzen Dials M.D.   On: 08/16/2023 19:05    ASSESSMENT: Iron  deficiency anemia.  PLAN:    Iron  deficiency anemia: Resolved.  Patient's hemoglobin and iron  stores continue to be within normal limits.  She does not require additional IV Venofer  today.  She last received treatment on January 16, 2022.  Patient has been instructed to continue oral iron  supplementation.  Patient has requested follow-up in 6 months for further evaluation.   Decreased MCV: Patient's MCV has been persistently decreased since December 2010.  Previous hemoglobin fractionation cascade was within normal limits.  Can consider testing for alpha thalassemia.  Heavy menses: Significantly improved with IUD placement.  Continue follow-up with OB/GYN as scheduled.   I spent a total of 20 minutes reviewing chart data, face-to-face evaluation with the patient, counseling and coordination of care as detailed above.   Patient expressed understanding and was in agreement with this plan. She also understands that She can call clinic at any time with any questions, concerns, or complaints.  Evalene JINNY Reusing, MD   09/14/2023 3:04 PM

## 2023-09-15 DIAGNOSIS — F840 Autistic disorder: Secondary | ICD-10-CM | POA: Diagnosis not present

## 2023-09-15 DIAGNOSIS — F909 Attention-deficit hyperactivity disorder, unspecified type: Secondary | ICD-10-CM | POA: Diagnosis not present

## 2023-09-15 DIAGNOSIS — F39 Unspecified mood [affective] disorder: Secondary | ICD-10-CM | POA: Diagnosis not present

## 2023-09-21 DIAGNOSIS — F840 Autistic disorder: Secondary | ICD-10-CM | POA: Diagnosis not present

## 2023-09-21 DIAGNOSIS — F909 Attention-deficit hyperactivity disorder, unspecified type: Secondary | ICD-10-CM | POA: Diagnosis not present

## 2023-09-21 DIAGNOSIS — F39 Unspecified mood [affective] disorder: Secondary | ICD-10-CM | POA: Diagnosis not present

## 2023-09-28 DIAGNOSIS — F902 Attention-deficit hyperactivity disorder, combined type: Secondary | ICD-10-CM | POA: Diagnosis not present

## 2023-09-28 DIAGNOSIS — F39 Unspecified mood [affective] disorder: Secondary | ICD-10-CM | POA: Diagnosis not present

## 2023-09-28 DIAGNOSIS — F840 Autistic disorder: Secondary | ICD-10-CM | POA: Diagnosis not present

## 2023-09-28 DIAGNOSIS — F909 Attention-deficit hyperactivity disorder, unspecified type: Secondary | ICD-10-CM | POA: Diagnosis not present

## 2023-09-28 DIAGNOSIS — F321 Major depressive disorder, single episode, moderate: Secondary | ICD-10-CM | POA: Diagnosis not present

## 2023-09-28 DIAGNOSIS — F411 Generalized anxiety disorder: Secondary | ICD-10-CM | POA: Diagnosis not present

## 2023-10-05 DIAGNOSIS — F411 Generalized anxiety disorder: Secondary | ICD-10-CM | POA: Diagnosis not present

## 2023-10-05 DIAGNOSIS — F902 Attention-deficit hyperactivity disorder, combined type: Secondary | ICD-10-CM | POA: Diagnosis not present

## 2023-10-05 DIAGNOSIS — F321 Major depressive disorder, single episode, moderate: Secondary | ICD-10-CM | POA: Diagnosis not present

## 2023-10-06 DIAGNOSIS — F39 Unspecified mood [affective] disorder: Secondary | ICD-10-CM | POA: Diagnosis not present

## 2023-10-06 DIAGNOSIS — F909 Attention-deficit hyperactivity disorder, unspecified type: Secondary | ICD-10-CM | POA: Diagnosis not present

## 2023-10-06 DIAGNOSIS — F840 Autistic disorder: Secondary | ICD-10-CM | POA: Diagnosis not present

## 2023-10-12 DIAGNOSIS — F39 Unspecified mood [affective] disorder: Secondary | ICD-10-CM | POA: Diagnosis not present

## 2023-10-12 DIAGNOSIS — F909 Attention-deficit hyperactivity disorder, unspecified type: Secondary | ICD-10-CM | POA: Diagnosis not present

## 2023-10-12 DIAGNOSIS — F840 Autistic disorder: Secondary | ICD-10-CM | POA: Diagnosis not present

## 2023-10-18 ENCOUNTER — Ambulatory Visit: Admitting: Physician Assistant

## 2023-10-19 DIAGNOSIS — F411 Generalized anxiety disorder: Secondary | ICD-10-CM | POA: Diagnosis not present

## 2023-10-19 DIAGNOSIS — F902 Attention-deficit hyperactivity disorder, combined type: Secondary | ICD-10-CM | POA: Diagnosis not present

## 2023-10-19 DIAGNOSIS — F321 Major depressive disorder, single episode, moderate: Secondary | ICD-10-CM | POA: Diagnosis not present

## 2023-10-19 DIAGNOSIS — F909 Attention-deficit hyperactivity disorder, unspecified type: Secondary | ICD-10-CM | POA: Diagnosis not present

## 2023-10-19 DIAGNOSIS — F39 Unspecified mood [affective] disorder: Secondary | ICD-10-CM | POA: Diagnosis not present

## 2023-10-19 DIAGNOSIS — F840 Autistic disorder: Secondary | ICD-10-CM | POA: Diagnosis not present

## 2023-10-26 DIAGNOSIS — F909 Attention-deficit hyperactivity disorder, unspecified type: Secondary | ICD-10-CM | POA: Diagnosis not present

## 2023-10-26 DIAGNOSIS — F39 Unspecified mood [affective] disorder: Secondary | ICD-10-CM | POA: Diagnosis not present

## 2023-10-26 DIAGNOSIS — F840 Autistic disorder: Secondary | ICD-10-CM | POA: Diagnosis not present

## 2023-11-17 ENCOUNTER — Encounter: Payer: Self-pay | Admitting: Oncology

## 2023-11-22 ENCOUNTER — Ambulatory Visit: Admitting: Physician Assistant

## 2023-12-20 ENCOUNTER — Encounter: Payer: Self-pay | Admitting: Oncology

## 2023-12-27 ENCOUNTER — Ambulatory Visit: Admitting: Physician Assistant

## 2023-12-31 ENCOUNTER — Encounter: Payer: Self-pay | Admitting: Oncology

## 2024-03-13 ENCOUNTER — Telehealth: Payer: Self-pay | Admitting: Oncology

## 2024-03-13 NOTE — Telephone Encounter (Signed)
 Pt called to r/s her appts. She stated the lab on 1/13 is okay but there is a scheduling conflict due to work on 1/14. Appts r/s to next available on 1/19 and time confirmed with pt.

## 2024-03-14 ENCOUNTER — Inpatient Hospital Stay: Attending: Oncology

## 2024-03-14 ENCOUNTER — Inpatient Hospital Stay: Payer: Self-pay | Admitting: Oncology

## 2024-03-14 ENCOUNTER — Encounter: Payer: Self-pay | Admitting: Oncology

## 2024-03-14 DIAGNOSIS — D509 Iron deficiency anemia, unspecified: Secondary | ICD-10-CM | POA: Insufficient documentation

## 2024-03-14 DIAGNOSIS — D500 Iron deficiency anemia secondary to blood loss (chronic): Secondary | ICD-10-CM

## 2024-03-14 LAB — CBC WITH DIFFERENTIAL/PLATELET
Abs Immature Granulocytes: 0.02 K/uL (ref 0.00–0.07)
Basophils Absolute: 0.1 K/uL (ref 0.0–0.1)
Basophils Relative: 1 %
Eosinophils Absolute: 0.1 K/uL (ref 0.0–0.5)
Eosinophils Relative: 1 %
HCT: 39.3 % (ref 36.0–46.0)
Hemoglobin: 12.6 g/dL (ref 12.0–15.0)
Immature Granulocytes: 0 %
Lymphocytes Relative: 22 %
Lymphs Abs: 1.9 K/uL (ref 0.7–4.0)
MCH: 25 pg — ABNORMAL LOW (ref 26.0–34.0)
MCHC: 32.1 g/dL (ref 30.0–36.0)
MCV: 78.1 fL — ABNORMAL LOW (ref 80.0–100.0)
Monocytes Absolute: 0.5 K/uL (ref 0.1–1.0)
Monocytes Relative: 5 %
Neutro Abs: 6.3 K/uL (ref 1.7–7.7)
Neutrophils Relative %: 71 %
Platelets: 283 K/uL (ref 150–400)
RBC: 5.03 MIL/uL (ref 3.87–5.11)
RDW: 14.9 % (ref 11.5–15.5)
WBC: 8.8 K/uL (ref 4.0–10.5)
nRBC: 0 % (ref 0.0–0.2)

## 2024-03-14 LAB — IRON AND TIBC
Iron: 66 ug/dL (ref 28–170)
Saturation Ratios: 21 % (ref 10.4–31.8)
TIBC: 318 ug/dL (ref 250–450)
UIBC: 252 ug/dL

## 2024-03-14 LAB — FERRITIN: Ferritin: 96 ng/mL (ref 11–307)

## 2024-03-15 ENCOUNTER — Inpatient Hospital Stay: Admitting: Oncology

## 2024-03-15 ENCOUNTER — Inpatient Hospital Stay

## 2024-03-15 ENCOUNTER — Ambulatory Visit: Admitting: Oncology

## 2024-03-20 ENCOUNTER — Telehealth: Payer: Self-pay | Admitting: Oncology

## 2024-03-20 ENCOUNTER — Inpatient Hospital Stay: Admitting: Oncology

## 2024-03-20 ENCOUNTER — Inpatient Hospital Stay

## 2024-03-20 NOTE — Telephone Encounter (Signed)
 Pt called to r/s appts for today - said she needed different day - pt confirmed new date/time - LH

## 2024-03-30 ENCOUNTER — Inpatient Hospital Stay: Admitting: Oncology

## 2024-03-30 ENCOUNTER — Inpatient Hospital Stay

## 2024-03-30 ENCOUNTER — Encounter: Payer: Self-pay | Admitting: Oncology
